# Patient Record
Sex: Female | Born: 1965 | Race: White | Hispanic: No
Health system: Southern US, Academic
[De-identification: ages and names within clinical notes are randomized; demographics above are authoritative.]

## PROBLEM LIST (undated history)

## (undated) ENCOUNTER — Encounter

## (undated) ENCOUNTER — Telehealth
Attending: Student in an Organized Health Care Education/Training Program | Primary: Student in an Organized Health Care Education/Training Program

## (undated) ENCOUNTER — Telehealth

## (undated) ENCOUNTER — Encounter
Attending: Student in an Organized Health Care Education/Training Program | Primary: Student in an Organized Health Care Education/Training Program

## (undated) ENCOUNTER — Encounter: Attending: Rheumatology | Primary: Rheumatology

## (undated) ENCOUNTER — Ambulatory Visit

## (undated) ENCOUNTER — Encounter: Payer: PRIVATE HEALTH INSURANCE | Attending: Rheumatology | Primary: Rheumatology

## (undated) ENCOUNTER — Encounter
Payer: PRIVATE HEALTH INSURANCE | Attending: Student in an Organized Health Care Education/Training Program | Primary: Student in an Organized Health Care Education/Training Program

## (undated) ENCOUNTER — Telehealth: Attending: Rheumatology | Primary: Rheumatology

## (undated) ENCOUNTER — Ambulatory Visit: Payer: PRIVATE HEALTH INSURANCE

## (undated) ENCOUNTER — Non-Acute Institutional Stay
Payer: PRIVATE HEALTH INSURANCE | Attending: Student in an Organized Health Care Education/Training Program | Primary: Student in an Organized Health Care Education/Training Program

## (undated) ENCOUNTER — Ambulatory Visit: Attending: Clinical | Primary: Clinical

## (undated) ENCOUNTER — Encounter: Attending: Internal Medicine | Primary: Internal Medicine

## (undated) ENCOUNTER — Ambulatory Visit
Payer: PRIVATE HEALTH INSURANCE | Attending: Student in an Organized Health Care Education/Training Program | Primary: Student in an Organized Health Care Education/Training Program

## (undated) ENCOUNTER — Ambulatory Visit
Payer: PRIVATE HEALTH INSURANCE | Attending: Physical Medicine & Rehabilitation | Primary: Physical Medicine & Rehabilitation

## (undated) ENCOUNTER — Telehealth: Attending: Internal Medicine | Primary: Internal Medicine

## (undated) DIAGNOSIS — I10 Essential (primary) hypertension: Secondary | ICD-10-CM

## (undated) DIAGNOSIS — J449 Chronic obstructive pulmonary disease, unspecified: Secondary | ICD-10-CM

## (undated) DIAGNOSIS — J45909 Unspecified asthma, uncomplicated: Secondary | ICD-10-CM

## (undated) DIAGNOSIS — G8929 Other chronic pain: Secondary | ICD-10-CM

## (undated) DIAGNOSIS — M549 Dorsalgia, unspecified: Secondary | ICD-10-CM

## (undated) HISTORY — PX: ABDOMINAL HYSTERECTOMY: SHX81

---

## 2006-06-29 ENCOUNTER — Emergency Department: Payer: Self-pay | Admitting: Emergency Medicine

## 2006-06-29 ENCOUNTER — Other Ambulatory Visit: Payer: Self-pay

## 2008-01-05 ENCOUNTER — Emergency Department: Payer: Self-pay | Admitting: Emergency Medicine

## 2009-04-07 ENCOUNTER — Emergency Department: Payer: Self-pay | Admitting: Emergency Medicine

## 2014-10-15 ENCOUNTER — Emergency Department: Payer: Self-pay | Admitting: Emergency Medicine

## 2015-01-08 ENCOUNTER — Emergency Department: Payer: Self-pay | Admitting: Emergency Medicine

## 2015-01-08 LAB — COMPREHENSIVE METABOLIC PANEL WITH GFR
Albumin: 3 g/dL — ABNORMAL LOW (ref 3.4–5.0)
Alkaline Phosphatase: 87 U/L (ref 46–116)
Anion Gap: 4 — ABNORMAL LOW (ref 7–16)
BUN: 12 mg/dL (ref 7–18)
Bilirubin,Total: 0.2 mg/dL (ref 0.2–1.0)
Calcium, Total: 8 mg/dL — ABNORMAL LOW (ref 8.5–10.1)
Chloride: 110 mmol/L — ABNORMAL HIGH (ref 98–107)
Co2: 28 mmol/L (ref 21–32)
Creatinine: 0.76 mg/dL (ref 0.60–1.30)
EGFR (African American): >60
EGFR (Non-African Amer.): >60
Glucose: 89 mg/dL (ref 65–99)
Osmolality: 282 (ref 275–301)
Potassium: 3.6 mmol/L (ref 3.5–5.1)
SGOT(AST): 18 U/L (ref 15–37)
SGPT (ALT): 14 U/L (ref 14–63)
Sodium: 142 mmol/L (ref 136–145)
Total Protein: 5.8 g/dL — ABNORMAL LOW (ref 6.4–8.2)

## 2015-01-08 LAB — CBC
HCT: 41.1 % (ref 35.0–47.0)
HGB: 13.5 g/dL (ref 12.0–16.0)
MCH: 28.4 pg (ref 26.0–34.0)
MCHC: 32.9 g/dL (ref 32.0–36.0)
MCV: 86 fL (ref 80–100)
PLATELETS: 211 10*3/uL (ref 150–440)
RBC: 4.76 10*6/uL (ref 3.80–5.20)
RDW: 14.8 % — AB (ref 11.5–14.5)
WBC: 5 10*3/uL (ref 3.6–11.0)

## 2015-08-13 ENCOUNTER — Emergency Department: Payer: Self-pay

## 2015-08-13 ENCOUNTER — Emergency Department
Admission: EM | Admit: 2015-08-13 | Discharge: 2015-08-14 | Disposition: A | Discharge status: Home / Self Care | Payer: Self-pay | Attending: Emergency Medicine | Admitting: Emergency Medicine

## 2015-08-13 ENCOUNTER — Encounter: Payer: Self-pay | Admitting: Emergency Medicine

## 2015-08-13 DIAGNOSIS — W07XXXA Fall from chair, initial encounter: Secondary | ICD-10-CM | POA: Insufficient documentation

## 2015-08-13 DIAGNOSIS — S199XXA Unspecified injury of neck, initial encounter: Secondary | ICD-10-CM | POA: Insufficient documentation

## 2015-08-13 DIAGNOSIS — Z720 Tobacco use: Secondary | ICD-10-CM | POA: Insufficient documentation

## 2015-08-13 DIAGNOSIS — T438X1A Poisoning by other psychotropic drugs, accidental (unintentional), initial encounter: Secondary | ICD-10-CM | POA: Insufficient documentation

## 2015-08-13 DIAGNOSIS — T50901A Poisoning by unspecified drugs, medicaments and biological substances, accidental (unintentional), initial encounter: Secondary | ICD-10-CM

## 2015-08-13 DIAGNOSIS — M542 Cervicalgia: Secondary | ICD-10-CM

## 2015-08-13 DIAGNOSIS — Z79899 Other long term (current) drug therapy: Secondary | ICD-10-CM | POA: Insufficient documentation

## 2015-08-13 DIAGNOSIS — Y9289 Other specified places as the place of occurrence of the external cause: Secondary | ICD-10-CM | POA: Insufficient documentation

## 2015-08-13 DIAGNOSIS — F1310 Sedative, hypnotic or anxiolytic abuse, uncomplicated: Secondary | ICD-10-CM | POA: Insufficient documentation

## 2015-08-13 DIAGNOSIS — T424X1A Poisoning by benzodiazepines, accidental (unintentional), initial encounter: Secondary | ICD-10-CM | POA: Insufficient documentation

## 2015-08-13 DIAGNOSIS — F1210 Cannabis abuse, uncomplicated: Secondary | ICD-10-CM | POA: Insufficient documentation

## 2015-08-13 DIAGNOSIS — S0990XA Unspecified injury of head, initial encounter: Secondary | ICD-10-CM | POA: Insufficient documentation

## 2015-08-13 DIAGNOSIS — S79912A Unspecified injury of left hip, initial encounter: Secondary | ICD-10-CM | POA: Insufficient documentation

## 2015-08-13 DIAGNOSIS — Y998 Other external cause status: Secondary | ICD-10-CM | POA: Insufficient documentation

## 2015-08-13 DIAGNOSIS — F1910 Other psychoactive substance abuse, uncomplicated: Secondary | ICD-10-CM

## 2015-08-13 DIAGNOSIS — Y9389 Activity, other specified: Secondary | ICD-10-CM | POA: Insufficient documentation

## 2015-08-13 DIAGNOSIS — S3992XA Unspecified injury of lower back, initial encounter: Secondary | ICD-10-CM | POA: Insufficient documentation

## 2015-08-13 DIAGNOSIS — F1410 Cocaine abuse, uncomplicated: Secondary | ICD-10-CM | POA: Insufficient documentation

## 2015-08-13 DIAGNOSIS — I10 Essential (primary) hypertension: Secondary | ICD-10-CM | POA: Insufficient documentation

## 2015-08-13 HISTORY — DX: Other chronic pain: G89.29

## 2015-08-13 HISTORY — DX: Dorsalgia, unspecified: M54.9

## 2015-08-13 HISTORY — DX: Chronic obstructive pulmonary disease, unspecified: J44.9

## 2015-08-13 HISTORY — DX: Essential (primary) hypertension: I10

## 2015-08-13 LAB — CBC WITH DIFFERENTIAL/PLATELET
BASOS PCT: 1 %
Basophils Absolute: 0 10*3/uL (ref 0–0.1)
Eosinophils Absolute: 0 10*3/uL (ref 0–0.7)
Eosinophils Relative: 0 %
HEMATOCRIT: 45.9 % (ref 35.0–47.0)
HEMOGLOBIN: 15 g/dL (ref 12.0–16.0)
LYMPHS ABS: 1.5 10*3/uL (ref 1.0–3.6)
LYMPHS PCT: 26 %
MCH: 28.5 pg (ref 26.0–34.0)
MCHC: 32.6 g/dL (ref 32.0–36.0)
MCV: 87.7 fL (ref 80.0–100.0)
MONOS PCT: 4 %
Monocytes Absolute: 0.2 10*3/uL (ref 0.2–0.9)
NEUTROS ABS: 4 10*3/uL (ref 1.4–6.5)
NEUTROS PCT: 69 %
Platelets: 209 10*3/uL (ref 150–440)
RBC: 5.24 MIL/uL — ABNORMAL HIGH (ref 3.80–5.20)
RDW: 15.4 % — ABNORMAL HIGH (ref 11.5–14.5)
WBC: 5.8 10*3/uL (ref 3.6–11.0)

## 2015-08-13 LAB — COMPREHENSIVE METABOLIC PANEL
ALBUMIN: 3.9 g/dL (ref 3.5–5.0)
ALK PHOS: 91 U/L (ref 38–126)
ALT: 11 U/L — ABNORMAL LOW (ref 14–54)
ANION GAP: 6 (ref 5–15)
AST: 14 U/L — ABNORMAL LOW (ref 15–41)
BILIRUBIN TOTAL: 0.2 mg/dL — AB (ref 0.3–1.2)
BUN: 8 mg/dL (ref 6–20)
CALCIUM: 8.7 mg/dL — AB (ref 8.9–10.3)
CO2: 28 mmol/L (ref 22–32)
Chloride: 105 mmol/L (ref 101–111)
Creatinine, Ser: 0.82 mg/dL (ref 0.44–1.00)
GFR calc Af Amer: >60 mL/min (ref >60)
GLUCOSE: 90 mg/dL (ref 65–99)
Potassium: 4.3 mmol/L (ref 3.5–5.1)
Sodium: 139 mmol/L (ref 135–145)
TOTAL PROTEIN: 6.4 g/dL — AB (ref 6.5–8.1)

## 2015-08-13 LAB — ETHANOL: Alcohol, Ethyl (B): <5 mg/dL (ref <5)

## 2015-08-13 NOTE — ED Notes (Signed)
Pt is very lethargic and speech is muffled. States she took 2 valium and gabapentin this afternoon

## 2015-08-13 NOTE — ED Provider Notes (Addendum)
[facility] Emergency Department Provider Note  ____________________________________________  Time seen: Approximately 8:59 PM  I have reviewed the triage vital signs and the nursing notes.   HISTORY  Chief Complaint Fall    HPI Kara Gill is a 49 y.o. female patient presents from fast track. She apparently had some Valium and gabapentin at home for her chronic pain. She then slipped and fell out of the chair and landed on her hip. Complains of low back pain and hip pain neck pain and headache. She is confused with slurred speech. X-rays of the C-spine and hip and chest were negative by CT her head that was negative as well. I'm uncertain as to whether patient's positive oxygen or not. I'm getting conflicting statements from the patient both in support of having oxygen and denying having oxygen or using oxygen at home. Patient is using all of her extremities equally and well. She is sitting up and laying back in bed spontaneously when she wants to although when I ask her to sit up complains of low back pain. It appears that the patient is intoxicated with her Valium.   Past Medical History  Diagnosis Date  . COPD (chronic obstructive pulmonary disease)   . Hypertension   . Chronic back pain     There are no active problems to display for this patient.   Past Surgical History  Procedure Laterality Date  . Abdominal hysterectomy      Current Outpatient Rx  Name  Route  Sig  Dispense  Refill  . FLUoxetine (PROZAC) 40 MG capsule   Oral   Take 40 mg by mouth daily.         Marland Kitchen gabapentin (NEURONTIN) 300 MG capsule   Oral   Take 300 mg by mouth 3 (three) times daily.           Allergies Codeine and Elavil  History reviewed. No pertinent family history.  Social History Social History  Substance Use Topics  . Smoking status: Current Every Day Smoker  . Smokeless tobacco: None  . Alcohol Use: No    Review of Systems Constitutional: No  fever/chills Eyes: No visual changes. ENT: No sore throat. Cardiovascular: Denies chest pain. Respiratory: Denies shortness of breath. Gastrointestinal: No abdominal pain.  No nausea, no vomiting.  No diarrhea.  No constipation. Genitourinary: Negative for dysuria. Musculoskeletal: See history of present illness Skin: Negative for rash. Neurological: Negative for headaches, focal weakness or numbness.  10-point ROS otherwise negative.  ____________________________________________   PHYSICAL EXAM:  VITAL SIGNS: ED Triage Vitals  Enc Vitals Group     BP 08/13/15 1822 131/83 mmHg     Pulse Rate 08/13/15 1822 89     Resp 08/13/15 1822 16     Temp 08/13/15 1822 99.9 F (37.7 C)     Temp Source 08/13/15 1822 Oral     SpO2 08/13/15 1822 100 %     Weight 08/13/15 1822 157 lb (71.215 kg)     Height 08/13/15 1822 5\' 3"  (1.6 m)     Head Cir --      Peak Flow --      Pain Score 08/13/15 1823 8     Pain Loc --      Pain Edu? --      Excl. in GC? --     Constitutional: Alert and oriented. Well appearing and in no acute distress. Eyes: Conjunctivae are normal. PERRL. EOMI. Head: Atraumatic. Nose: No congestion/rhinnorhea. Mouth/Throat: Mucous membranes are  moist.  Oropharynx non-erythematous. Neck: No stridor.   Cardiovascular: Normal rate, regular rhythm. Grossly normal heart sounds.  Good peripheral circulation. Respiratory: Normal respiratory effort.  No retractions. Lungs CTAB. Gastrointestinal: Soft and nontender. No distention. No abdominal bruits. No CVA tenderness. Musculoskeletal: No lower extremity tenderness nor edema.  No joint effusions. Low back is diffusely tender from about T11-S1. Neurologic:  Normal speech and language. No gross focal neurologic deficits are appreciated. No gait instability. Skin:  Skin is warm, dry and intact. No rash noted. Psychiatric: Mood and affect are normal. Speech and behavior are normal.  ____________________________________________    LABS (all labs ordered are listed, but only abnormal results are displayed)  Labs Reviewed  CBC WITH DIFFERENTIAL/PLATELET - Abnormal; Notable for the following:    RBC 5.24 (*)    RDW 15.4 (*)    All other components within normal limits  COMPREHENSIVE METABOLIC PANEL - Abnormal; Notable for the following:    Calcium 8.7 (*)    Total Protein 6.4 (*)    AST 14 (*)    ALT 11 (*)    Total Bilirubin 0.2 (*)    All other components within normal limits  ETHANOL   ____________________________________________  EKG  EKG read and interpreted by me shows normal sinus rhythm at a rate of 93 normal axis basically no acute changes ____________________________________________  RADIOLOGY  CT of the head is negative neck x-ray chest x-ray and pelvis are hip x-ray and low-dose L spine x-ray are all negative per the radiologist I also reviewed the films myself ____________________________________________   PROCEDURES    ____________________________________________   INITIAL IMPRESSION / ASSESSMENT AND PLAN / ED COURSE  Pertinent labs & imaging results that were available during my care of the patient were reviewed by me and considered in my medical decision making (see chart for details). Discussed with patient's mother I believe the patient has accidentally overdosed on her Valium and gabapentin I will keep her in the emergency room and observed for hopefully she will wake up in the morning to go home. At present she is on oxygen 2 L nasal cannula to maintain her O2 sats above 90. She will without the oxygen dropped down to 88 intermittently. _________________________________________   FINAL CLINICAL IMPRESSION(S) / ED DIAGNOSES  Final diagnoses:  Accidental overdose, initial encounter      Arnaldo Natal, MD 08/13/15 2305 Dr. Manson Passey will monitor the patient overnight hopefully we can discharge her in the morning she is still dependent on oxygen at this time  Arnaldo Natal,  MD 08/13/15 2306

## 2015-08-13 NOTE — ED Notes (Signed)
Lab called regarding urine sample.  Lab states they cannot find urine sample anywhere.  Attempted to get second sample using bed pan.  Pt unable to provide sample.  MD notified and states discontinue UDS.

## 2015-08-13 NOTE — ED Notes (Signed)
Pt placed on 2L O2 via [place] due to o2sat in 80s

## 2015-08-13 NOTE — ED Notes (Signed)
Per Dr. Darnelle Catalan, hold pt overnight and see if o2sat improves once pt is more awake.  Pt's mother leaving, contact info Theresia Majors [phone], daughter Gladis Riffle [phone]

## 2015-08-13 NOTE — ED Notes (Signed)
States she fell from chair  Landed on left hip The patient has a history of chronic back pain

## 2015-08-13 NOTE — ED Provider Notes (Signed)
[facility] Emergency Department Provider Note  ____________________________________________  Time seen: Approximately 6:23 PM  I have reviewed the triage vital signs and the nursing notes.   HISTORY  Chief Complaint Fall    HPI Kara Gill is a 49 y.o. female patient arrived via EMS secondary to a fall from a kitchen chair patient is complaining of left hip pain and neck pain. Patient denies any head injury.Patient is lethargic and 6 speech is muffled. Patient admits to taking 2 Valiums and Gabapentin this afternoon but she did not know the dosage. Patient is also complaining of neck pain. He was noticed that the patient had O2 sat on room air of 88-89%. Patient state that she is supposedly on home oxygen secondary to her COPD, but is not currently using it. Patient is rating her pain which is confined mostly to the hip as a 8/10.  Past Medical History  Diagnosis Date  . COPD (chronic obstructive pulmonary disease)   . Hypertension   . Chronic back pain     There are no active problems to display for this patient.   Past Surgical History  Procedure Laterality Date  . Abdominal hysterectomy      Current Outpatient Rx  Name  Route  Sig  Dispense  Refill  . FLUoxetine (PROZAC) 40 MG capsule   Oral   Take 40 mg by mouth daily.         Marland Kitchen gabapentin (NEURONTIN) 300 MG capsule   Oral   Take 300 mg by mouth 3 (three) times daily.           Allergies Codeine and Elavil  History reviewed. No pertinent family history.  Social History Social History  Substance Use Topics  . Smoking status: Current Every Day Smoker  . Smokeless tobacco: None  . Alcohol Use: No    Review of Systems Constitutional: No fever/chills. Patient's lethargic but responds to verbal stimuli. Eyes: No visual changes. ENT: No sore throat. Cardiovascular: Denies chest pain. Respiratory: Denies shortness of breath. Gastrointestinal: No abdominal pain.  No nausea, no  vomiting.  No diarrhea.  No constipation. Genitourinary: Negative for dysuria. Musculoskeletal: Complaining of neck, left hip and back pain Skin: Negative for rash. Neurological: Negative for headaches, focal weakness or numbness. Psychiatric:Depression Endocrine:Hypertension Hematological/Lymphatic: Allergic/Immunilogical: See medication list  10-point ROS otherwise negative.  ____________________________________________   PHYSICAL EXAM:  VITAL SIGNS: ED Triage Vitals  Enc Vitals Group     BP 08/13/15 1822 131/83 mmHg     Pulse Rate 08/13/15 1822 89     Resp 08/13/15 1822 16     Temp 08/13/15 1822 99.9 F (37.7 C)     Temp Source 08/13/15 1822 Oral     SpO2 08/13/15 1822 100 %     Weight 08/13/15 1822 157 lb (71.215 kg)     Height 08/13/15 1822  (1.6 m)     Head Cir --      Peak Flow --      Pain Score 08/13/15 1823 8     Pain Loc --      Pain Edu? --      Excl. in GC? --     Constitutional: Alert and oriented. Well appearing and in no acute distress. Patient's lethargic Eyes: Conjunctivae are normal. PERRL. EOMI. Head: Atraumatic. Nose: No congestion/rhinnorhea. Mouth/Throat: Mucous membranes are moist.  Oropharynx non-erythematous. Neck: No stridor.   cervical spine tenderness to palpation C4 and 5. Hematological/Lymphatic/Immunilogical: No cervical lymphadenopathy. Cardiovascular: Normal rate,  regular rhythm. Grossly normal heart sounds.  Good peripheral circulation. Respiratory: Normal respiratory effort.  No retractions. Lungs CTAB. Patient O2 sat is between 80 and 89% on room air. Patient placed on oxygen 4 L/M  and O2 sat increased to to 94%. Gastrointestinal: Soft and nontender. No distention. No abdominal bruits. No CVA tenderness. Musculoskeletal: Cervical guarding at C4-5. Guarding palpation the left lateral hip.  Neurologic:  Normal speech and language. No gross focal neurologic deficits are appreciated. No gait instability. Skin:  Skin is warm,  dry and intact. No rash noted. Psychiatric: Patient is referred lethargic ____________________________________________   LABS (all labs ordered are listed, but only abnormal results are displayed)  Labs Reviewed  CBC WITH DIFFERENTIAL/PLATELET - Abnormal; Notable for the following:    RBC 5.24 (*)    RDW 15.4 (*)    All other components within normal limits  COMPREHENSIVE METABOLIC PANEL - Abnormal; Notable for the following:    Calcium 8.7 (*)    Total Protein 6.4 (*)    AST 14 (*)    ALT 11 (*)    Total Bilirubin 0.2 (*)    All other components within normal limits  ETHANOL  URINE DRUG SCREEN, QUALITATIVE ([facility] ONLY)   ____________________________________________  EKG   ____________________________________________  RADIOLOGY   ____________________________________________   PROCEDURES  Procedure(s) performed: None  Critical Care performed: No  ____________________________________________   INITIAL IMPRESSION / ASSESSMENT AND PLAN / ED COURSE  Pertinent labs & imaging results that were available during my care of the patient were reviewed by me and considered in my medical decision making (see chart for details).  Patient continued to be lethargic and disorientated upon reexamination. Patient will be transferred over to the major area of the emergency room for definitive evaluation and treatment. ____________________________________________   FINAL CLINICAL IMPRESSION(S) / ED DIAGNOSES  Final diagnoses:  Neck pain      Joni Reining, PA-C 08/13/15 2055  Sharman Cheek, MD 08/13/15 [phone]

## 2015-08-13 NOTE — ED Notes (Addendum)
Pt reports falling after slipping on spilt tea.  Pt having difficulty keeping eyes open, mumbling speech.  Pt's mother reports pt was given valium last night at [facility] for back pain.  Pt c/o back pain.  Pt on 2L o2 currently, reports she is supposed to be on o2 at home but doesn't wear it.  Pt reports she does not have oxygen equipment at home.

## 2015-08-14 LAB — URINE DRUG SCREEN, QUALITATIVE (ARMC ONLY)
AMPHETAMINES, UR SCREEN: NOT DETECTED
Barbiturates, Ur Screen: NOT DETECTED
Benzodiazepine, Ur Scrn: POSITIVE — AB
CANNABINOID 50 NG, UR ~~LOC~~: POSITIVE — AB
COCAINE METABOLITE, UR ~~LOC~~: POSITIVE — AB
MDMA (ECSTASY) UR SCREEN: NOT DETECTED
Methadone Scn, Ur: POSITIVE — AB
Opiate, Ur Screen: NOT DETECTED
Phencyclidine (PCP) Ur S: NOT DETECTED
TRICYCLIC, UR SCREEN: POSITIVE — AB

## 2015-08-14 NOTE — ED Provider Notes (Signed)
I assumed care of the patient 11:00 PM from Dr. Juliette Alcide. Attempted to evaluate the patient however patient was very somnolent, appeared intoxicated. Patient's urine drug screen was ordered and revealed    Urine Drug Screen, Qualitative ([facility] only) (Final result)Abnormal Component (Lab Inquiry)    Collection Time Result Time Tricyclic, Ur Screen Amphetamines, Ur Screen MDMA (Ecstasy)Ur Screen Cocaine Metabolite,Ur [place] Opiate, Ur Screen   08/14/15 02:15:00 08/14/15 02:49:34 POSITIVE (A) NONE DETECTED NONE DETECTED POSITIVE (A) NONE DETECTED      Collection Time Result Time Phencyclidine (PCP) Ur S Cannabinoid 50 Ng, Ur [place] Barbiturates, Ur Screen Benzodiazepine, Ur Scrn Methadone Scn, Ur   08/14/15 02:15:00 08/14/15 02:49:34 NONE DETECTED POSITIVE (A) NONE DETECTED POSITIVE (A)  POSITIVE (A)       ----------------------------------------- 3:52 AM on 08/14/2015 -----------------------------------------  Patient more awake at this time. I asked the patient what medications or illegal drug use she had yesterday today and she stated "a few things". When asked what things she took she stated I don't know. Patient x-ray and CT reviewed revealing no gross abnormality   Imaging Results       DG Lumbar Spine Complete (Final result) Result time: 08/13/15 20:33:10   Final result by Rad Results In Interface (08/13/15 20:33:10)   Narrative:   CLINICAL DATA: Status post fall out of chair, with chronic lower back pain. Initial encounter.  EXAM: LUMBAR SPINE - COMPLETE 4+ VIEW  COMPARISON: None.  FINDINGS: There is no evidence of fracture or subluxation. The right-sided pars at the upper lumbar spine are not well assessed on oblique images. Vertebral bodies demonstrate normal height and alignment. Intervertebral disc spaces are preserved. The visualized neural foramina are grossly unremarkable in appearance.  The visualized bowel gas pattern is unremarkable in appearance; air and  stool are noted within the colon. The sacroiliac joints are within normal limits.  IMPRESSION: No evidence of fracture or subluxation along the lumbar spine.   Electronically Signed By: Roanna Raider M.D. On: 08/13/2015 20:33          CT Head Wo Contrast (Final result) Result time: 08/13/15 20:32:10   Final result by Rad Results In Interface (08/13/15 20:32:10)   Narrative:   CLINICAL DATA: Larey Seat from a chair landing on the left hip.  EXAM: CT HEAD WITHOUT CONTRAST  TECHNIQUE: Contiguous axial images were obtained from the base of the skull through the vertex without intravenous contrast.  COMPARISON: 01/08/2015  FINDINGS: Ventricles are normal in size and configuration. There are no parenchymal masses or mass effect. There is no evidence of an infarct. There are no extra-axial masses or abnormal fluid collections. There is no intracranial hemorrhage.  Visualized sinuses and mastoid air cells are clear. No skull lesion or fracture.  IMPRESSION: No intracranial abnormality.   Electronically Signed By: Amie Portland M.D. On: 08/13/2015 20:32          DG HIP UNILAT WITH PELVIS 1V LEFT (Final result) Result time: 08/13/15 19:11:18   Final result by Rad Results In Interface (08/13/15 19:11:18)   Narrative:   CLINICAL DATA: Status post fall from chair, landing on left hip. Left hip pain. Initial encounter.  EXAM: DG HIP (WITH OR WITHOUT PELVIS) 1V*L*  COMPARISON: None.  FINDINGS: There is no evidence of fracture or dislocation. Both femoral heads are seated normally within their respective acetabula. The proximal left femur appears intact. No significant degenerative change is appreciated. The sacroiliac joints are unremarkable in appearance.  The visualized bowel gas pattern is grossly unremarkable in  appearance. Scattered phleboliths are noted within the pelvis.  IMPRESSION: No evidence of fracture or  dislocation.   Electronically Signed By: Roanna Raider M.D. On: 08/13/2015 19:11          DG Chest 2 View (Final result) Result time: 08/13/15 19:10:04   Final result by Rad Results In Interface (08/13/15 19:10:04)   Narrative:   CLINICAL DATA: Cough and chest pain today. Status post fall today.  EXAM: CHEST 2 VIEW  COMPARISON: None.  FINDINGS: Heart size and mediastinal contours are within normal limits. Both lungs are clear. Visualized skeletal structures are unremarkable.  IMPRESSION: No acute disease.   Electronically Signed By: Drusilla Kanner M.D. On: 08/13/2015 19:10          DG Cervical Spine 2 or 3 views (Final result) Result time: 08/13/15 19:11:00   Procedure changed from [facility] Cervical Spine 2-3Vclearing      Final result by Rad Results In Interface (08/13/15 19:11:00)   Narrative:   CLINICAL DATA: Status post fall today. Neck pain. Initial encounter.  EXAM: CERVICAL SPINE - 2-3 VIEW  COMPARISON: None.  FINDINGS: Vertebral body height and alignment are normal. Intervertebral disc space height is normal. The facet joints are unremarkable. Prevertebral soft tissues appear normal. The lung apices are clear.  IMPRESSION: Negative exam.   Electronically Signed By: Drusilla Kanner M.D. On: 08/13/2015 19:11           Darci Current, MD 08/14/15 [phone]

## 2015-08-14 NOTE — ED Notes (Signed)
Oxygen removed by Dr. Manson Passey

## 2015-08-14 NOTE — ED Notes (Signed)
Called pt's mother and told her pt would be discharged soon.  Pt's mother states she will be at hospital in about 20-54min.

## 2015-08-14 NOTE — Discharge Instructions (Signed)
Due to patient's progressive lethargic and disorientation she is transferred over to the major area of the emergency room for definitive evaluation and treatment. Accidental Overdose A drug overdose occurs when a chemical substance (drug or medication) is used in amounts large enough to overcome a person. This may result in severe illness or death. This is a type of poisoning. Accidental overdoses of medications or other substances come from a variety of reasons. When this happens accidentally, it is often because the person taking the substance does not know enough about what they have taken. Drugs which commonly cause overdose deaths are alcohol, psychotropic medications (medications which affect the mind), pain medications, illegal drugs (street drugs) such as cocaine and heroin, and multiple drugs taken at the same time. It may result from careless behavior (such as over-indulging at a party). Other causes of overdose may include multiple drug use, a lapse in memory, or drug use after a period of no drug use.  Sometimes overdosing occurs because a person cannot remember if they have taken their medication.  A common unintentional overdose in young children involves multi-vitamins containing iron. Iron is a part of the hemoglobin molecule in blood. It is used to transport oxygen to living cells. When taken in small amounts, iron allows the body to restock hemoglobin. In large amounts, it causes problems in the body. If this overdose is not treated, it can lead to death. Never take medicines that show signs of tampering or do not seem quite right. Never take medicines in the dark or in poor lighting. Read the label and check each dose of medicine before you take it. When adults are poisoned, it happens most often through carelessness or lack of information. Taking medicines in the dark or taking medicine prescribed for someone else to treat the same type of problem is a dangerous practice. SYMPTOMS    Symptoms of overdose depend on the medication and amount taken. They can vary from over-activity with stimulant over-dosage, to sleepiness from depressants such as alcohol, narcotics and tranquilizers. Confusion, dizziness, nausea and vomiting may be present. If problems are severe enough coma and death may result. DIAGNOSIS  Diagnosis and management are generally straightforward if the drug is known. Otherwise it is more difficult. At times, certain symptoms and signs exhibited by the patient, or blood tests, can reveal the drug in question.  TREATMENT  In an emergency department, most patients can be treated with supportive measures. Antidotes may be available if there has been an overdose of opioids or benzodiazepines. A rapid improvement will often occur if this is the cause of overdose. At home or away from medical care:  There may be no immediate problems or warning signs in children.  Not everything works well in all cases of poisoning.  Take immediate action. Poisons may act quickly.  If you think someone has swallowed medicine or a household product, and the person is unconscious, having seizures (convulsions), or is not breathing, immediately call for an ambulance. IF a person is conscious and appears to be doing OK but has swallowed a poison:  Do not wait to see what effect the poison will have. Immediately call a poison control center (listed in the white pages of your telephone book under "Poison Control" or inside the front cover with other emergency numbers). Some poison control centers have TTY capability for the deaf. Check with your local center if you or someone in your family requires this service.  Keep the container so you can read  the label on the product for ingredients.  Describe what, when, and how much was taken and the age and condition of the person poisoned. Inform them if the person is vomiting, choking, drowsy, shows a change in color or temperature of skin, is  conscious or unconscious, or is convulsing.  Do not cause vomiting unless instructed by medical personnel. Do not induce vomiting or force liquids into a person who is convulsing, unconscious, or very drowsy. Stay calm and in control.   Activated charcoal also is sometimes used in certain types of poisoning and you may wish to add a supply to your emergency medicines. It is available without a prescription. Call a poison control center before using this medication. PREVENTION  Thousands of children die every year from unintentional poisoning. This may be from household chemicals, poisoning from carbon monoxide in a car, taking their parent's medications, or simply taking a few iron pills or vitamins with iron. Poisoning comes from unexpected sources.  Store medicines out of the sight and reach of children, preferably in a locked cabinet. Do not keep medications in a food cabinet. Always store your medicines in a secure place. Get rid of expired medications.  If you have children living with you or have them as occasional guests, you should have child-resistant caps on your medicine containers. Keep everything out of reach. Child proof your home.  If you are called to the telephone or to answer the door while you are taking a medicine, take the container with you or put the medicine out of the reach of small children.  Do not take your medication in front of children. Do not tell your child how good a medication is and how good it is for them. They may get the idea it is more of a treat.  If you are an adult and have accidentally taken an overdose, you need to consider how this happened and what can be done to prevent it from happening again. If this was from a street drug or alcohol, determine if there is a problem that needs addressing. If you are not sure a problems exists, it is easy to talk to a professional and ask them if they think you have a problem. It is better to handle this problem in  this way before it happens again and has a much worse consequence. Document Released: 02/11/2005 Document Revised: 02/20/2012 Document Reviewed: 07/20/2009 [facility] Patient Information 2015 [place], [place]. This information is not intended to replace advice given to you by your health care provider. Make sure you discuss any questions you have with your health care provider.  Polysubstance Abuse When people abuse more than one drug or type of drug it is called polysubstance or polydrug abuse. For example, many smokers also drink alcohol. This is one form of polydrug abuse. Polydrug abuse also refers to the use of a drug to counteract an unpleasant effect produced by another drug. It may also be used to help with withdrawal from another drug. People who take stimulants may become agitated. Sometimes this agitation is countered with a tranquilizer. This helps protect against the unpleasant side effects. Polydrug abuse also refers to the use of different drugs at the same time.  Anytime drug use is interfering with normal living activities, it has become abuse. This includes problems with family and friends. Psychological dependence has developed when your mind tells you that the drug is needed. This is usually followed by physical dependence which has developed when continuing increases of  drug are required to get the same feeling or "high". This is known as addiction or chemical dependency. A person's risk is much higher if there is a history of chemical dependency in the family. SIGNS OF CHEMICAL DEPENDENCY  You have been told by friends or family that drugs have become a problem.  You fight when using drugs.  You are having blackouts (not remembering what you do while using).  You feel sick from using drugs but continue using.  You lie about use or amounts of drugs (chemicals) used.  You need chemicals to get you going.  You are suffering in work performance or in school because of drug  use.  You get sick from use of drugs but continue to use anyway.  You need drugs to relate to people or feel comfortable in social situations.  You use drugs to forget problems. "Yes" answered to any of the above signs of chemical dependency indicates there are problems. The longer the use of drugs continues, the greater the problems will become. If there is a family history of drug or alcohol use, it is best not to experiment with these drugs. Continual use leads to tolerance. After tolerance develops more of the drug is needed to get the same feeling. This is followed by addiction. With addiction, drugs become the most important part of life. It becomes more important to take drugs than participate in the other usual activities of life. This includes relating to friends and family. Addiction is followed by dependency. Dependency is a condition where drugs are now needed not just to get high, but to feel normal. Addiction cannot be cured but it can be stopped. This often requires outside help and the care of professionals. Treatment centers are listed in the yellow pages under: Cocaine, Narcotics, and Alcoholics Anonymous. Most hospitals and clinics can refer you to a specialized care center. Talk to your caregiver if you need help. Document Released: 07/20/2005 Document Revised: 02/20/2012 Document Reviewed: 11/28/2005 [facility] Patient Information 2015 [place], [place]. This information is not intended to replace advice given to you by your health care provider. Make sure you discuss any questions you have with your health care provider.

## 2017-12-17 ENCOUNTER — Encounter: Payer: Self-pay | Admitting: Emergency Medicine

## 2017-12-17 ENCOUNTER — Emergency Department: Payer: No Typology Code available for payment source

## 2017-12-17 ENCOUNTER — Other Ambulatory Visit: Payer: Self-pay

## 2017-12-17 DIAGNOSIS — Z79899 Other long term (current) drug therapy: Secondary | ICD-10-CM | POA: Diagnosis not present

## 2017-12-17 DIAGNOSIS — M5442 Lumbago with sciatica, left side: Secondary | ICD-10-CM | POA: Diagnosis not present

## 2017-12-17 DIAGNOSIS — M5441 Lumbago with sciatica, right side: Secondary | ICD-10-CM | POA: Insufficient documentation

## 2017-12-17 DIAGNOSIS — G8929 Other chronic pain: Secondary | ICD-10-CM | POA: Diagnosis not present

## 2017-12-17 DIAGNOSIS — I10 Essential (primary) hypertension: Secondary | ICD-10-CM | POA: Insufficient documentation

## 2017-12-17 DIAGNOSIS — M545 Low back pain: Secondary | ICD-10-CM | POA: Diagnosis present

## 2017-12-17 DIAGNOSIS — M7918 Myalgia, other site: Secondary | ICD-10-CM | POA: Insufficient documentation
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
## 2017-12-18 ENCOUNTER — Emergency Department: Payer: No Typology Code available for payment source

## 2017-12-18 ENCOUNTER — Emergency Department
Admission: EM | Admit: 2017-12-18 | Discharge: 2017-12-18 | Disposition: A | Discharge status: Home / Self Care | Payer: No Typology Code available for payment source | Attending: Emergency Medicine | Admitting: Emergency Medicine

## 2017-12-18 ENCOUNTER — Encounter: Payer: Self-pay | Admitting: Radiology

## 2017-12-18 DIAGNOSIS — M5440 Lumbago with sciatica, unspecified side: Secondary | ICD-10-CM

## 2017-12-18 DIAGNOSIS — M7918 Myalgia, other site: Secondary | ICD-10-CM

## 2017-12-18 LAB — COMPREHENSIVE METABOLIC PANEL
ALBUMIN: 3.9 g/dL (ref 3.5–5.0)
ALK PHOS: 83 U/L (ref 38–126)
ALT: 13 U/L — ABNORMAL LOW (ref 14–54)
ANION GAP: 7 (ref 5–15)
AST: 22 U/L (ref 15–41)
BUN: 9 mg/dL (ref 6–20)
CALCIUM: 9 mg/dL (ref 8.9–10.3)
CO2: 26 mmol/L (ref 22–32)
Chloride: 105 mmol/L (ref 101–111)
Creatinine, Ser: 0.59 mg/dL (ref 0.44–1.00)
GFR calc Af Amer: >60 mL/min (ref >60)
GFR calc non Af Amer: >60 mL/min (ref >60)
GLUCOSE: 97 mg/dL (ref 65–99)
Potassium: 3.7 mmol/L (ref 3.5–5.1)
SODIUM: 138 mmol/L (ref 135–145)
Total Bilirubin: 0.6 mg/dL (ref 0.3–1.2)
Total Protein: 6.9 g/dL (ref 6.5–8.1)

## 2017-12-18 LAB — CBC
HCT: 42.1 % (ref 35.0–47.0)
HEMOGLOBIN: 14.4 g/dL (ref 12.0–16.0)
MCH: 29.6 pg (ref 26.0–34.0)
MCHC: 34.3 g/dL (ref 32.0–36.0)
MCV: 86.4 fL (ref 80.0–100.0)
Platelets: 190 10*3/uL (ref 150–440)
RBC: 4.87 MIL/uL (ref 3.80–5.20)
RDW: 13.7 % (ref 11.5–14.5)
WBC: 5.2 10*3/uL (ref 3.6–11.0)

## 2017-12-18 MED ORDER — IOPAMIDOL (ISOVUE-300) INJECTION 61%
100.0000 mL | Freq: Once | INTRAVENOUS | Status: AC | PRN
  Administered 2017-12-18: 100 mL via INTRAVENOUS

## 2017-12-18 MED ORDER — MORPHINE SULFATE (PF) 4 MG/ML IV SOLN
4.0000 mg | Freq: Once | INTRAVENOUS | Status: AC
  Administered 2017-12-18: 4 mg via INTRAVENOUS

## 2017-12-18 MED ORDER — ONDANSETRON HCL 4 MG/2ML IJ SOLN
INTRAMUSCULAR | Status: AC
  Administered 2017-12-18: 4 mg via INTRAVENOUS
  Filled 2017-12-18: qty 2

## 2017-12-18 MED ORDER — LIDOCAINE 5 % EX PTCH: 1.0000 | MEDICATED_PATCH | Freq: Two times a day (BID) | CUTANEOUS | 0 refills | Status: AC

## 2017-12-18 MED ORDER — TRAMADOL HCL 50 MG PO TABS: 50.0000 mg | ORAL_TABLET | Freq: Four times a day (QID) | ORAL | 0 refills | Status: AC | PRN

## 2017-12-18 MED ORDER — METHYLPREDNISOLONE SODIUM SUCC 125 MG IJ SOLR
125.0000 mg | Freq: Once | INTRAMUSCULAR | Status: AC
  Administered 2017-12-18: 125 mg via INTRAVENOUS
  Filled 2017-12-18: qty 2

## 2017-12-18 MED ORDER — ONDANSETRON HCL 4 MG/2ML IJ SOLN
4.0000 mg | Freq: Once | INTRAMUSCULAR | Status: AC
  Administered 2017-12-18: 4 mg via INTRAVENOUS

## 2017-12-18 MED ORDER — PREDNISONE 10 MG (21) PO TBPK: ORAL_TABLET | ORAL | 0 refills | Status: DC

## 2017-12-18 MED ORDER — MORPHINE SULFATE (PF) 4 MG/ML IV SOLN
INTRAVENOUS | Status: AC
  Administered 2017-12-18: 4 mg via INTRAVENOUS
  Filled 2017-12-18: qty 1

## 2017-12-18 MED ORDER — LIDOCAINE 5 % EX PTCH
1.0000 | MEDICATED_PATCH | CUTANEOUS | Status: DC
  Administered 2017-12-18: 1 via TRANSDERMAL
  Filled 2017-12-18: qty 1

## 2017-12-18 NOTE — ED Notes (Signed)
AAOx3.  Skin warm and dry.  NAD 
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
## 2017-12-18 NOTE — Discharge Instructions (Signed)
Please follow up with your primary care physician for further evaluation °

## 2017-12-18 NOTE — ED Notes (Addendum)
Unsuccessful IV attempt x3, (LAC, LW)

## 2017-12-18 NOTE — ED Notes (Signed)
MRI called stating ready for patient, explained that ED did not have staff to accompany patient and we were instructed that they should call radiology for staff to stay with them and patient.

## 2017-12-18 NOTE — ED Notes (Signed)
MRI returned call and stated radiology did not have staff either.  Between MRI and ED staff will wait until more staff available for patient safety.

## 2017-12-18 NOTE — ED Notes (Signed)
Patient resting quietly at this time, awaiting MRI.

## 2017-12-18 NOTE — ED Notes (Signed)
Patient sitting in wheelchair in no distress at this time.  

## 2018-01-15 ENCOUNTER — Encounter: Payer: Self-pay | Admitting: Urology

## 2018-01-15 ENCOUNTER — Encounter: Payer: Self-pay | Admitting: General Surgery

## 2018-02-08 ENCOUNTER — Emergency Department
Admission: EM | Admit: 2018-02-08 | Discharge: 2018-02-08 | Disposition: A | Discharge status: Home / Self Care | Payer: Self-pay | Attending: Student in an Organized Health Care Education/Training Program | Admitting: Student in an Organized Health Care Education/Training Program

## 2018-02-08 ENCOUNTER — Encounter: Payer: Self-pay | Admitting: Emergency Medicine

## 2018-02-08 DIAGNOSIS — M545 Low back pain, unspecified: Secondary | ICD-10-CM

## 2018-02-08 DIAGNOSIS — Z202 Contact with and (suspected) exposure to infections with a predominantly sexual mode of transmission: Secondary | ICD-10-CM | POA: Insufficient documentation

## 2018-02-08 DIAGNOSIS — F17210 Nicotine dependence, cigarettes, uncomplicated: Secondary | ICD-10-CM | POA: Insufficient documentation

## 2018-02-08 DIAGNOSIS — I10 Essential (primary) hypertension: Secondary | ICD-10-CM | POA: Insufficient documentation

## 2018-02-08 DIAGNOSIS — Z79899 Other long term (current) drug therapy: Secondary | ICD-10-CM | POA: Insufficient documentation

## 2018-02-08 DIAGNOSIS — J449 Chronic obstructive pulmonary disease, unspecified: Secondary | ICD-10-CM | POA: Insufficient documentation

## 2018-02-08 LAB — URINALYSIS, COMPLETE (UACMP) WITH MICROSCOPIC
Bilirubin Urine: NEGATIVE
Glucose, UA: NEGATIVE mg/dL
Ketones, ur: NEGATIVE mg/dL
Nitrite: NEGATIVE
Protein, ur: NEGATIVE mg/dL
Specific Gravity, Urine: 1.004 — ABNORMAL LOW (ref 1.005–1.030)
Squamous Epithelial / HPF: NONE SEEN
pH: 6 (ref 5.0–8.0)

## 2018-02-08 LAB — WET PREP, GENITAL
Sperm: NONE SEEN
Trich, Wet Prep: NONE SEEN
Yeast Wet Prep HPF POC: NONE SEEN

## 2018-02-08 LAB — CHLAMYDIA/NGC RT PCR (ARMC ONLY)
Chlamydia Tr: NOT DETECTED
N gonorrhoeae: NOT DETECTED

## 2018-02-08 MED ORDER — HYDROCODONE-ACETAMINOPHEN 10-325 MG PO TABS: 1.0000 | ORAL_TABLET | Freq: Four times a day (QID) | ORAL | 0 refills | Status: AC | PRN

## 2018-02-08 MED ORDER — CEFTRIAXONE SODIUM 250 MG IJ SOLR
250.0000 mg | Freq: Once | INTRAMUSCULAR | Status: AC
  Administered 2018-02-08: 250 mg via INTRAMUSCULAR
  Filled 2018-02-08: qty 250

## 2018-02-08 MED ORDER — METRONIDAZOLE 500 MG PO TABS
2000.0000 mg | ORAL_TABLET | Freq: Once | ORAL | Status: AC
  Administered 2018-02-08: 2000 mg via ORAL
  Filled 2018-02-08: qty 4

## 2018-02-08 MED ORDER — AZITHROMYCIN 500 MG PO TABS
1000.0000 mg | ORAL_TABLET | Freq: Once | ORAL | Status: AC
  Administered 2018-02-08: 1000 mg via ORAL
  Filled 2018-02-08: qty 2

## 2018-02-08 NOTE — ED Notes (Signed)
Pt went back out to lobby
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
## 2018-02-08 NOTE — ED Notes (Signed)
Pt. Verbalizes understanding of d/c instructions, medications, and follow-up. VS stable.  Pt. In NAD at time of d/c and denies further concerns regarding this visit. Pt. Stable at the time of departure from the unit, departing unit by the safest and most appropriate manner per that pt condition and limitations with all belongings accounted for. Pt advised to return to the ED at any time for emergent concerns, or for new/worsening symptoms.   

## 2018-02-08 NOTE — ED Provider Notes (Signed)
[facility] Emergency Department Provider Note  ____________________________________________  Time seen: Approximately 9:37 PM  I have reviewed the triage vital signs and the nursing notes.   HISTORY  Chief Complaint Back Pain    HPI Kara Gill is a 52 y.o. female presents to the emergency department with chronic low back pain and exposure to chlamydia.  Patient reports that she was previously under the care of pain management and has recently become gainfully employed with health insurance benefits.  Patient reports that she becomes eligible for her health insurance benefits on April 01, 2018.  Patient reports that she is seeking Norco 10 for her chronic low back pain and that she has not had a prescription for narcotics within the last 2 years.  Patient is requesting a 3-day supply for breakthrough pain.  She denies falls or mechanisms of trauma as well as bowel or bladder incontinence and saddle anesthesia.  Patient secondarily reports an episode of unprotected sex and is concerned about possible chlamydia infection.  She denies changes in vaginal discharge, dyspareunia, dysuria, hematuria and increased urine.  No flank pain.   Past Medical History:  Diagnosis Date  . Chronic back pain   . COPD (chronic obstructive pulmonary disease) (HCC)   . Hypertension     There are no active problems to display for this patient.   Past Surgical History:  Procedure Laterality Date  . ABDOMINAL HYSTERECTOMY    . CESAREAN SECTION      Prior to Admission medications   Medication Sig Start Date End Date Taking? Authorizing Provider  Albuterol Sulfate 108 (90 Base) MCG/ACT AEPB Inhale 2 puffs into the lungs every 4 (four) hours as needed.    [provider]  clonazePAM (KLONOPIN) 2 MG tablet Take 2 mg by mouth 2 (two) times daily.    [provider]  gabapentin (NEURONTIN) 300 MG capsule Take 300 mg by mouth 3 (three) times daily.    [provider]  HYDROcodone-acetaminophen (NORCO) 10-325 MG tablet Take 1 tablet by mouth every 6 (six) hours as needed for up to 3 days. 02/08/18 02/11/18  Orvil Feil, PA-C  lidocaine (LIDODERM) 5 % Place 1 patch onto the skin every 12 (twelve) hours. Remove & Discard patch within 12 hours or as directed by MD 12/18/17 12/18/18  Rebecka Apley, MD  predniSONE (STERAPRED UNI-PAK 21 TAB) 10 MG (21) TBPK tablet Take 6 tabs on day 1 Take 5 tabs on day 2 Take 4 tabs on day 3 Take 3 tabs on day 4 Take 2 tabs on day 5 Take 1 tab on day 6 12/18/17   Rebecka Apley, MD  pregabalin (LYRICA) 100 MG capsule Take 100 mg by mouth 2 (two) times daily.    [provider]  traMADol (ULTRAM) 50 MG tablet Take 1 tablet (50 mg total) by mouth every 6 (six) hours as needed. 12/18/17   Rebecka Apley, MD    Allergies Codeine and Elavil [amitriptyline hcl]  No family history on file.  Social History Social History   Tobacco Use  . Smoking status: Current Every Day Smoker  . Smokeless tobacco: Never Used  Substance Use Topics  . Alcohol use: No  . Drug use: Not on file     Review of Systems  Constitutional: No fever/chills Eyes: No visual changes. No discharge ENT: No upper respiratory complaints. Cardiovascular: no chest pain. Respiratory: no cough. No SOB. Gastrointestinal: No abdominal pain.  No nausea, no vomiting.  No diarrhea.  No constipation. Genitourinary: Patient has exposure to STD.  Musculoskeletal: Patient has low back pain.  Skin: Negative for rash, abrasions, lacerations, ecchymosis. Neurological: Negative for headaches, focal weakness or numbness.   ____________________________________________   PHYSICAL EXAM:  VITAL SIGNS: ED Triage Vitals  Enc Vitals Group     BP 02/08/18 1707 (!) 157/104     Pulse Rate 02/08/18 1707 72     Resp 02/08/18 1707 16     Temp 02/08/18 1707 98.4 F (36.9 C)     Temp Source 02/08/18 1707 Oral     SpO2 02/08/18 1707 94 %      Weight --      Height --      Head Circumference --      Peak Flow --      Pain Score 02/08/18 1705 9     Pain Loc --      Pain Edu? --      Excl. in GC? --      Constitutional: Alert and oriented. Well appearing and in no acute distress. Eyes: Conjunctivae are normal. PERRL. EOMI. Head: Atraumatic. ENT: Neck: No stridor. No cervical spine tenderness to palpation. Cardiovascular: Normal rate, regular rhythm. Normal S1 and S2.  Good peripheral circulation. Respiratory: Normal respiratory effort without tachypnea or retractions. Lungs CTAB. Good air entry to the bases with no decreased or absent breath sounds. Gastrointestinal: Bowel sounds 4 quadrants. Soft and nontender to palpation. No guarding or rigidity. No palpable masses. No distention. No CVA tenderness. Genitourinary: No cervical motion tenderness.  No excessive discharge within the vaginal vault. Musculoskeletal: Full range of motion to all extremities. No gross deformities appreciated. Neurologic:  Normal speech and language. No gross focal neurologic deficits are appreciated.  Skin:  Skin is warm, dry and intact. No rash noted.  ____________________________________________   LABS (all labs ordered are listed, but only abnormal results are displayed)  Labs Reviewed  WET PREP, GENITAL - Abnormal; Notable for the following components:      Result Value   Clue Cells Wet Prep HPF POC PRESENT (*)    WBC, Wet Prep HPF POC MODERATE (*)    All other components within normal limits  URINALYSIS, COMPLETE (UACMP) WITH MICROSCOPIC - Abnormal; Notable for the following components:   Color, Urine STRAW (*)    APPearance CLEAR (*)    Specific Gravity, Urine 1.004 (*)    Hgb urine dipstick SMALL (*)    Leukocytes, UA SMALL (*)    Bacteria, UA RARE (*)    All other components within normal limits  CHLAMYDIA/NGC RT PCR ([facility] ONLY)    ____________________________________________  EKG   ____________________________________________  RADIOLOGY   No results found.  ____________________________________________    PROCEDURES  Procedure(s) performed:    Procedures    Medications  cefTRIAXone (ROCEPHIN) injection 250 mg (250 mg Intramuscular Given 02/08/18 2047)  azithromycin (ZITHROMAX) tablet 1,000 mg (1,000 mg Oral Given 02/08/18 2045)  metroNIDAZOLE (FLAGYL) tablet 2,000 mg (2,000 mg Oral Given 02/08/18 2045)     ____________________________________________   INITIAL IMPRESSION / ASSESSMENT AND PLAN / ED COURSE  Pertinent labs & imaging results that were available during my care of the patient were reviewed by me and considered in my medical decision making (see chart for details).  Review of the [place] CSRS was performed in accordance of the [facility] prior to dispensing any controlled drugs.     Assessment and plan STD exposure Chronic back pain Patient presents to the emergency department  with STD exposure.  Differential diagnosis included gonorrhea, chlamydia, trichomoniasis and yeast vaginosis.  Patient tested negative for gonorrhea and chlamydia in the emergency department.  However, patient opted to be treated empirically and received Rocephin, azithromycin and metronidazole in the emergency department.  I consulted the [facility] drug database and patient has not received recent narcotic prescriptions within the last 2 years.  Patient's request for Norco was granted with the understanding that she would not receive additional Norco at future ED visits.  Patient voiced understanding.  All patient questions were answered.    ____________________________________________  FINAL CLINICAL IMPRESSION(S) / ED DIAGNOSES  Final diagnoses:  Acute bilateral low back pain without sciatica  STD exposure      NEW MEDICATIONS STARTED DURING THIS VISIT:  ED Discharge Orders        Ordered     HYDROcodone-acetaminophen (NORCO) 10-325 MG tablet  Every 6 hours PRN     02/08/18 2039          This chart was dictated using voice recognition software/Dragon. Despite best efforts to proofread, errors can occur which can change the meaning. Any change was purely unintentional.    Gasper Lloyd 02/08/18 2158    Willy Eddy, MD 02/08/18 2242

## 2018-02-08 NOTE — ED Notes (Signed)
See triage note  Presents with pain to lower back    States hx of same d/t injury about 20 years ago  Denies any recent injury   States pain has gotten worse over the past couple of days

## 2018-04-24 ENCOUNTER — Encounter: Payer: Self-pay | Admitting: Emergency Medicine

## 2018-04-24 ENCOUNTER — Other Ambulatory Visit: Payer: Self-pay

## 2018-04-24 ENCOUNTER — Emergency Department
Admission: EM | Admit: 2018-04-24 | Discharge: 2018-04-24 | Disposition: A | Discharge status: Home / Self Care | Payer: Self-pay | Attending: Emergency Medicine | Admitting: Emergency Medicine

## 2018-04-24 ENCOUNTER — Emergency Department: Payer: Self-pay

## 2018-04-24 DIAGNOSIS — Y9301 Activity, walking, marching and hiking: Secondary | ICD-10-CM | POA: Insufficient documentation

## 2018-04-24 DIAGNOSIS — F17200 Nicotine dependence, unspecified, uncomplicated: Secondary | ICD-10-CM | POA: Insufficient documentation

## 2018-04-24 DIAGNOSIS — Y999 Unspecified external cause status: Secondary | ICD-10-CM | POA: Insufficient documentation

## 2018-04-24 DIAGNOSIS — W19XXXA Unspecified fall, initial encounter: Secondary | ICD-10-CM

## 2018-04-24 DIAGNOSIS — W0110XA Fall on same level from slipping, tripping and stumbling with subsequent striking against unspecified object, initial encounter: Secondary | ICD-10-CM | POA: Insufficient documentation

## 2018-04-24 DIAGNOSIS — I10 Essential (primary) hypertension: Secondary | ICD-10-CM | POA: Insufficient documentation

## 2018-04-24 DIAGNOSIS — Z79899 Other long term (current) drug therapy: Secondary | ICD-10-CM | POA: Insufficient documentation

## 2018-04-24 DIAGNOSIS — J449 Chronic obstructive pulmonary disease, unspecified: Secondary | ICD-10-CM | POA: Insufficient documentation

## 2018-04-24 DIAGNOSIS — M545 Low back pain: Secondary | ICD-10-CM | POA: Insufficient documentation

## 2018-04-24 DIAGNOSIS — Y929 Unspecified place or not applicable: Secondary | ICD-10-CM | POA: Insufficient documentation

## 2018-04-24 MED ORDER — PREDNISONE 50 MG PO TABS: ORAL_TABLET | ORAL | 0 refills | Status: DC

## 2018-04-24 MED ORDER — KETOROLAC TROMETHAMINE 30 MG/ML IJ SOLN
30.0000 mg | Freq: Once | INTRAMUSCULAR | Status: AC
  Administered 2018-04-24: 30 mg via INTRAMUSCULAR
  Filled 2018-04-24: qty 1

## 2018-04-24 MED ORDER — ORPHENADRINE CITRATE 30 MG/ML IJ SOLN
60.0000 mg | Freq: Two times a day (BID) | INTRAMUSCULAR | Status: DC
  Administered 2018-04-24: 60 mg via INTRAMUSCULAR
  Filled 2018-04-24: qty 2

## 2018-04-24 NOTE — ED Notes (Signed)
See triage note  States she took a fall  Having pain to left hip which is moving into leg  No shortening noted  Increased pain with movement

## 2018-04-24 NOTE — ED Provider Notes (Signed)
[facility] Emergency Department Provider Note  ____________________________________________  Time seen: Approximately 4:45 PM  I have reviewed the triage vital signs and the nursing notes.   HISTORY  Chief Complaint Back Pain and Fall    HPI Kara Gill is a 52 y.o. female with a history of chronic low back pain, presents to the emergency department after a fall that occurred one day ago.  Patient reports that she fell on her "butt" and felt a crunch.  Patient denies weakness or changes in sensation of the lower extremities.  She has chronic radiculopathy of the left lower extremity. After receiving x-rays today at [facility] ED, patient reports that she "fell again".  After fall in emergency department today, patient is reporting that she is having mild neck pain.  Patient did not lose consciousness.  She is denying new blurry vision, nausea or headache.  No alleviating measures have been attempted.   Past Medical History:  Diagnosis Date  . Chronic back pain   . COPD (chronic obstructive pulmonary disease) (HCC)   . Hypertension     There are no active problems to display for this patient.   Past Surgical History:  Procedure Laterality Date  . ABDOMINAL HYSTERECTOMY    . CESAREAN SECTION      Prior to Admission medications   Medication Sig Start Date End Date Taking? Authorizing Provider  Albuterol Sulfate 108 (90 Base) MCG/ACT AEPB Inhale 2 puffs into the lungs every 4 (four) hours as needed.    [provider]  clonazePAM (KLONOPIN) 2 MG tablet Take 2 mg by mouth 2 (two) times daily.    [provider]  gabapentin (NEURONTIN) 300 MG capsule Take 300 mg by mouth 3 (three) times daily.    [provider]  lidocaine (LIDODERM) 5 % Place 1 patch onto the skin every 12 (twelve) hours. Remove & Discard patch within 12 hours or as directed by MD 12/18/17 12/18/18  Rebecka Apley, MD  predniSONE (DELTASONE) 50 MG tablet Take one 50 mg  tablet once daily for the next 5 days. 04/24/18   Orvil Feil, PA-C  pregabalin (LYRICA) 100 MG capsule Take 100 mg by mouth 2 (two) times daily.    [provider]  traMADol (ULTRAM) 50 MG tablet Take 1 tablet (50 mg total) by mouth every 6 (six) hours as needed. 12/18/17   Rebecka Apley, MD    Allergies Codeine and Elavil [amitriptyline hcl]  No family history on file.  Social History Social History   Tobacco Use  . Smoking status: Current Every Day Smoker  . Smokeless tobacco: Never Used  Substance Use Topics  . Alcohol use: No  . Drug use: Not on file     Review of Systems  Constitutional: No fever/chills Eyes: No visual changes. No discharge ENT: No upper respiratory complaints. Cardiovascular: no chest pain. Respiratory: no cough. No SOB. Gastrointestinal: No abdominal pain.  No nausea, no vomiting.  No diarrhea.  No constipation. Musculoskeletal: Patient has chronic low back pain.  Skin: Negative for rash, abrasions, lacerations, ecchymosis. Neurological: Negative for headaches, focal weakness or numbness.   ____________________________________________   PHYSICAL EXAM:  VITAL SIGNS: ED Triage Vitals  Enc Vitals Group     BP 04/24/18 1400 109/74     Pulse Rate 04/24/18 1400 69     Resp 04/24/18 1400 16     Temp 04/24/18 1400 98.5 F (36.9 C)     Temp Source 04/24/18 1400 Oral  SpO2 04/24/18 1400 96 %     Weight 04/24/18 1402 160 lb (72.6 kg)     Height 04/24/18 1402  (1.626 m)     Head Circumference --      Peak Flow --      Pain Score 04/24/18 1402 7     Pain Loc --      Pain Edu? --      Excl. in GC? --      Constitutional: Alert and oriented. Well appearing and in no acute distress. Eyes: Conjunctivae are normal. PERRL. EOMI. Head: Atraumatic. ENT:      Ears: TMs are pearly bilaterally.      Nose: No congestion/rhinnorhea.      Mouth/Throat: Mucous membranes are moist.  Neck: No stridor.  Patient has cervical spine  tenderness to palpation. Cardiovascular: Normal rate, regular rhythm. Normal S1 and S2.  Good peripheral circulation. Respiratory: Normal respiratory effort without tachypnea or retractions. Lungs CTAB. Good air entry to the bases with no decreased or absent breath sounds. Musculoskeletal: Full range of motion to all extremities. No gross deformities appreciated.  Patient has tenderness to palpation along the paraspinal muscles of the lumbar spine.  Positive straight leg raise, left. Neurologic:  Normal speech and language. No gross focal neurologic deficits are appreciated.  Skin:  Skin is warm, dry and intact. No rash noted.   ____________________________________________   LABS (all labs ordered are listed, but only abnormal results are displayed)  Labs Reviewed - No data to display ____________________________________________  EKG   ____________________________________________  RADIOLOGY Geraldo Pitter, personally viewed and evaluated these images (plain radiographs) as part of my medical decision making, as well as reviewing the written report by the radiologist.  Dg Cervical Spine 2-3 Views  Result Date: 04/24/2018 CLINICAL DATA:  RIGHT-sided neck pain after fall today. EXAM: CERVICAL SPINE - 2-3 VIEW COMPARISON:  None. FINDINGS: There is no evidence of cervical spine fracture or prevertebral soft tissue swelling. Alignment is normal. No other significant bone abnormalities are identified. IMPRESSION: Negative cervical spine radiographs. Electronically Signed   By: Bary Richard M.D.   On: 04/24/2018 16:46   Dg Lumbar Spine 2-3 Views  Result Date: 04/24/2018 CLINICAL DATA:  Low back pain due to a fall today. Initial encounter. EXAM: LUMBAR SPINE - 2-3 VIEW COMPARISON:  Plain films lumbar spine 12/17/2017. MRI lumbar spine 12/18/2017. FINDINGS: Convex left scoliosis is again seen. No fracture or listhesis. Mild degenerative disease is stable in appearance. Large colonic stool  burden noted. IMPRESSION: No acute abnormality. Convex left scoliosis. Large stool burden throughout the colon. Electronically Signed   By: Drusilla Kanner M.D.   On: 04/24/2018 15:33   Dg Sacrum/coccyx  Result Date: 04/24/2018 CLINICAL DATA:  Low back pain after fall today. EXAM: SACRUM AND COCCYX - 2+ VIEW COMPARISON:  None. FINDINGS: There is no evidence of fracture or other focal bone lesions. IMPRESSION: Normal sacrum and coccyx. Electronically Signed   By: Lupita Raider, M.D.   On: 04/24/2018 15:33    ____________________________________________    PROCEDURES  Procedure(s) performed:    Procedures    Medications  orphenadrine (NORFLEX) injection 60 mg (60 mg Intramuscular Given 04/24/18 1654)  ketorolac (TORADOL) 30 MG/ML injection 30 mg (30 mg Intramuscular Given 04/24/18 1653)     ____________________________________________   INITIAL IMPRESSION / ASSESSMENT AND PLAN / ED COURSE  Pertinent labs & imaging results that were available during my care of the patient were reviewed by me and  considered in my medical decision making (see chart for details).  Review of the [place] CSRS was performed in accordance of the [facility] prior to dispensing any controlled drugs.     Assessment and Plan:  Fall Patient presents to the emergency department after a fall that occurred 1 day ago.  Patient also experienced a fall in the emergency department today.  Differential diagnosis included contusion versus fracture.  No acute fractures were identified on x-rays of the cervical spine, lumbar spine and sacrum.  Patient was given injections of Norflex and Toradol in the emergency department.  She was discharged with prednisone.  Patient requested narcotic pain medication at discharge and request was denied given recent prescription for Norco in the [facility] drug database.  Vital signs are reassuring prior to discharge.  All patient questions were  answered.   ____________________________________________  FINAL CLINICAL IMPRESSION(S) / ED DIAGNOSES  Final diagnoses:  Fall, initial encounter      NEW MEDICATIONS STARTED DURING THIS VISIT:  ED Discharge Orders        Ordered    predniSONE (DELTASONE) 50 MG tablet     04/24/18 1704          This chart was dictated using voice recognition software/Dragon. Despite best efforts to proofread, errors can occur which can change the meaning. Any change was purely unintentional.    Orvil Feil, PA-C 04/24/18 1745    Sharman Cheek, MD 04/25/18 [phone]

## 2018-04-24 NOTE — ED Notes (Signed)
Back from x-ray   Requesting to use the bathroom  Pt in w/c and taking to bathroom

## 2018-04-24 NOTE — ED Triage Notes (Signed)
Larey Seat today due to leg gives out due to sciatica.  Landed on bottom and felt something crunch.  Tried tylenol and advil without relief.

## 2018-05-04 NOTE — ED Notes (Signed)
Late entry  Found on floor by Pt Advocate  Per pt states she fell   Was able to get back up with assistance  Provider made aware

## 2018-08-17 ENCOUNTER — Emergency Department
Admission: EM | Admit: 2018-08-17 | Discharge: 2018-08-18 | Disposition: A | Discharge status: Home / Self Care | Payer: Self-pay | Attending: Emergency Medicine | Admitting: Emergency Medicine

## 2018-08-17 ENCOUNTER — Encounter: Payer: Self-pay | Admitting: Emergency Medicine

## 2018-08-17 DIAGNOSIS — R0789 Other chest pain: Secondary | ICD-10-CM | POA: Insufficient documentation

## 2018-08-17 DIAGNOSIS — F17210 Nicotine dependence, cigarettes, uncomplicated: Secondary | ICD-10-CM | POA: Insufficient documentation

## 2018-08-17 DIAGNOSIS — Y999 Unspecified external cause status: Secondary | ICD-10-CM | POA: Insufficient documentation

## 2018-08-17 DIAGNOSIS — H538 Other visual disturbances: Secondary | ICD-10-CM | POA: Insufficient documentation

## 2018-08-17 DIAGNOSIS — J449 Chronic obstructive pulmonary disease, unspecified: Secondary | ICD-10-CM | POA: Insufficient documentation

## 2018-08-17 DIAGNOSIS — Z79899 Other long term (current) drug therapy: Secondary | ICD-10-CM | POA: Insufficient documentation

## 2018-08-17 DIAGNOSIS — Y939 Activity, unspecified: Secondary | ICD-10-CM | POA: Insufficient documentation

## 2018-08-17 DIAGNOSIS — Y92009 Unspecified place in unspecified non-institutional (private) residence as the place of occurrence of the external cause: Secondary | ICD-10-CM | POA: Insufficient documentation

## 2018-08-17 DIAGNOSIS — S161XXA Strain of muscle, fascia and tendon at neck level, initial encounter: Secondary | ICD-10-CM | POA: Insufficient documentation

## 2018-08-17 DIAGNOSIS — X500XXA Overexertion from strenuous movement or load, initial encounter: Secondary | ICD-10-CM | POA: Insufficient documentation

## 2018-08-17 DIAGNOSIS — R0602 Shortness of breath: Secondary | ICD-10-CM | POA: Insufficient documentation

## 2018-08-17 DIAGNOSIS — I10 Essential (primary) hypertension: Secondary | ICD-10-CM | POA: Insufficient documentation

## 2018-08-17 DIAGNOSIS — R51 Headache: Secondary | ICD-10-CM | POA: Insufficient documentation

## 2018-08-17 DIAGNOSIS — R064 Hyperventilation: Secondary | ICD-10-CM | POA: Insufficient documentation

## 2018-08-17 HISTORY — DX: Unspecified asthma, uncomplicated: J45.909
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
## 2018-08-18 ENCOUNTER — Emergency Department: Payer: Self-pay

## 2018-08-18 LAB — COMPREHENSIVE METABOLIC PANEL
ALK PHOS: 87 U/L (ref 38–126)
ALT: 11 U/L (ref 0–44)
ANION GAP: 4 — AB (ref 5–15)
AST: 15 U/L (ref 15–41)
Albumin: 3.6 g/dL (ref 3.5–5.0)
BUN: 11 mg/dL (ref 6–20)
CALCIUM: 8 mg/dL — AB (ref 8.9–10.3)
CO2: 26 mmol/L (ref 22–32)
CREATININE: 0.65 mg/dL (ref 0.44–1.00)
Chloride: 109 mmol/L (ref 98–111)
GFR calc non Af Amer: >60 mL/min (ref >60)
Glucose, Bld: 115 mg/dL — ABNORMAL HIGH (ref 70–99)
Potassium: 3.3 mmol/L — ABNORMAL LOW (ref 3.5–5.1)
Sodium: 139 mmol/L (ref 135–145)
Total Bilirubin: 0.4 mg/dL (ref 0.3–1.2)
Total Protein: 6.2 g/dL — ABNORMAL LOW (ref 6.5–8.1)

## 2018-08-18 LAB — CBC WITH DIFFERENTIAL/PLATELET
BASOS ABS: 0.1 10*3/uL (ref 0–0.1)
BASOS PCT: 1 %
EOS ABS: 0 10*3/uL (ref 0–0.7)
Eosinophils Relative: 0 %
HEMATOCRIT: 44.4 % (ref 35.0–47.0)
HEMOGLOBIN: 15.2 g/dL (ref 12.0–16.0)
Lymphocytes Relative: 26 %
Lymphs Abs: 2.1 10*3/uL (ref 1.0–3.6)
MCH: 29.3 pg (ref 26.0–34.0)
MCHC: 34.3 g/dL (ref 32.0–36.0)
MCV: 85.4 fL (ref 80.0–100.0)
Monocytes Absolute: 0.4 10*3/uL (ref 0.2–0.9)
Monocytes Relative: 5 %
NEUTROS ABS: 5.5 10*3/uL (ref 1.4–6.5)
NEUTROS PCT: 68 %
Platelets: 261 10*3/uL (ref 150–440)
RBC: 5.2 MIL/uL (ref 3.80–5.20)
RDW: 14.6 % — AB (ref 11.5–14.5)
WBC: 8.1 10*3/uL (ref 3.6–11.0)

## 2018-08-18 MED ORDER — FENTANYL CITRATE (PF) 100 MCG/2ML IJ SOLN
100.0000 ug | Freq: Once | INTRAMUSCULAR | Status: AC
  Administered 2018-08-18: 100 ug via INTRAVENOUS
  Filled 2018-08-18: qty 2

## 2018-08-18 MED ORDER — DIAZEPAM 5 MG PO TABS: 5.0000 mg | ORAL_TABLET | Freq: Three times a day (TID) | ORAL | 0 refills | Status: AC | PRN

## 2018-08-18 MED ORDER — IOHEXOL 350 MG/ML SOLN
75.0000 mL | Freq: Once | INTRAVENOUS | Status: AC | PRN
  Administered 2018-08-18: 75 mL via INTRAVENOUS

## 2018-08-18 MED ORDER — DICLOFENAC SODIUM 1 % TD GEL: 4.0000 g | Freq: Four times a day (QID) | TRANSDERMAL | 0 refills | Status: AC | PRN

## 2018-08-18 MED ORDER — DIAZEPAM 5 MG PO TABS
5.0000 mg | ORAL_TABLET | Freq: Once | ORAL | Status: AC
  Administered 2018-08-18: 5 mg via ORAL
  Filled 2018-08-18: qty 1

## 2018-08-18 MED ORDER — HALOPERIDOL LACTATE 5 MG/ML IJ SOLN
5.0000 mg | Freq: Once | INTRAMUSCULAR | Status: AC
Start: 2018-08-18 — End: 2018-08-18
  Administered 2018-08-18: 5 mg via INTRAVENOUS
  Filled 2018-08-18: qty 1

## 2018-08-18 MED ORDER — LIDOCAINE HCL (PF) 1 % IJ SOLN
5.0000 mL | Freq: Once | INTRAMUSCULAR | Status: AC
  Administered 2018-08-18: 5 mL via INTRADERMAL
  Filled 2018-08-18: qty 5

## 2018-08-18 MED ORDER — KETOROLAC TROMETHAMINE 30 MG/ML IJ SOLN
15.0000 mg | Freq: Once | INTRAMUSCULAR | Status: AC
  Administered 2018-08-18: 15 mg via INTRAVENOUS
  Filled 2018-08-18: qty 1

## 2018-08-18 NOTE — Discharge Instructions (Signed)
Please use your muscle relaxants only as needed for severe symptoms.  Use your pain gel to help relieve the tension in your neck.  Please establish care with primary care for reevaluation and return to the emergency department for any concerns.  It was a pleasure to take care of you today, and thank you for coming to our emergency department.  If you have any questions or concerns before leaving please ask the nurse to grab me and I'm more than happy to go through your aftercare instructions again.  If you were prescribed any opioid pain medication today such as Norco, Vicodin, Percocet, morphine, hydrocodone, or oxycodone please make sure you do not drive when you are taking this medication as it can alter your ability to drive safely.  If you have any concerns once you are home that you are not improving or are in fact getting worse before you can make it to your follow-up appointment, please do not hesitate to call [phone] and come back for further evaluation.  Merrily Brittle, MD  Results for orders placed or performed during the hospital encounter of 08/17/18  Comprehensive metabolic panel  Result Value Ref Range   Sodium 139 135 - 145 mmol/L   Potassium 3.3 (L) 3.5 - 5.1 mmol/L   Chloride 109 98 - 111 mmol/L   CO2 26 22 - 32 mmol/L   Glucose, Bld 115 (H) 70 - 99 mg/dL   BUN 11 6 - 20 mg/dL   Creatinine, Ser 6.31 0.44 - 1.00 mg/dL   Calcium 8.0 (L) 8.9 - 10.3 mg/dL   Total Protein 6.2 (L) 6.5 - 8.1 g/dL   Albumin 3.6 3.5 - 5.0 g/dL   AST 15 15 - 41 U/L   ALT 11 0 - 44 U/L   Alkaline Phosphatase 87 38 - 126 U/L   Total Bilirubin 0.4 0.3 - 1.2 mg/dL   GFR calc non Af Amer >60 >60 mL/min   GFR calc Af Amer >60 >60 mL/min   Anion gap 4 (L) 5 - 15  CBC with Differential  Result Value Ref Range   WBC 8.1 3.6 - 11.0 K/uL   RBC 5.20 3.80 - 5.20 MIL/uL   Hemoglobin 15.2 12.0 - 16.0 g/dL   HCT 49.7 02.6 - 37.8 %   MCV 85.4 80.0 - 100.0 fL   MCH 29.3 26.0 - 34.0 pg   MCHC 34.3 32.0 - 36.0  g/dL   RDW 58.8 (H) 50.2 - 77.4 %   Platelets 261 150 - 440 K/uL   Neutrophils Relative % 68 %   Neutro Abs 5.5 1.4 - 6.5 K/uL   Lymphocytes Relative 26 %   Lymphs Abs 2.1 1.0 - 3.6 K/uL   Monocytes Relative 5 %   Monocytes Absolute 0.4 0.2 - 0.9 K/uL   Eosinophils Relative 0 %   Eosinophils Absolute 0.0 0 - 0.7 K/uL   Basophils Relative 1 %   Basophils Absolute 0.1 0 - 0.1 K/uL   Ct Angio Neck W And/or Wo Contrast  Result Date: 08/18/2018 CLINICAL DATA:  Neck pain with blurry vision. EXAM: CT ANGIOGRAPHY NECK TECHNIQUE: Multidetector CT imaging of the neck was performed using the standard protocol during bolus administration of intravenous contrast. Multiplanar CT image reconstructions and MIPs were obtained to evaluate the vascular anatomy. Carotid stenosis measurements (when applicable) are obtained utilizing NASCET criteria, using the distal internal carotid diameter as the denominator. CONTRAST:  48mL OMNIPAQUE IOHEXOL 350 MG/ML SOLN COMPARISON:  None. FINDINGS: Aortic arch: There  is no calcific atherosclerosis of the aortic arch. There is no aneurysm, dissection or hemodynamically significant stenosis of the visualized ascending aorta and aortic arch. Conventional 3 vessel aortic branching pattern. The visualized proximal subclavian arteries are widely patent. Right carotid system: --Common carotid artery: Widely patent origin without common carotid artery dissection or aneurysm. --Internal carotid artery: No dissection, occlusion or aneurysm. No hemodynamically significant stenosis. --External carotid artery: No acute abnormality. Left carotid system: --Common carotid artery: Widely patent origin without common carotid artery dissection or aneurysm. --Internal carotid artery:No dissection, occlusion or aneurysm. No hemodynamically significant stenosis. --External carotid artery: No acute abnormality. Vertebral arteries: Left dominant configuration. Both origins are normal. No dissection,  occlusion or flow-limiting stenosis to the vertebrobasilar confluence. Skeleton: There is no bony spinal canal stenosis. No lytic or blastic lesion. Other neck: Normal pharynx, larynx and major salivary glands. No cervical lymphadenopathy. Unremarkable thyroid gland. Upper chest: No pneumothorax or pleural effusion. No nodules or masses. Review of the MIP images confirms the above findings IMPRESSION: 1. No vertebral artery dissection or other acute vascular abnormality. 2. Normal alignment of the cervical spine. No sizable disc herniation. Electronically Signed   By: Deatra Robinson M.D.   On: 08/18/2018 02:09

## 2018-09-26 ENCOUNTER — Encounter
Admit: 2018-09-26 | Discharge: 2018-09-26 | Disposition: A | Discharge status: Home / Self Care | Payer: PRIVATE HEALTH INSURANCE | Attending: Emergency Medicine

## 2018-09-26 DIAGNOSIS — M542 Cervicalgia: Principal | ICD-10-CM

## 2018-09-26 MED ORDER — CEPHALEXIN 250 MG CAPSULE
ORAL_CAPSULE | Freq: Four times a day (QID) | ORAL | 0 refills | 0.00000 days | Status: CP
Start: 2018-09-26 — End: 2018-10-01

## 2018-10-02 ENCOUNTER — Encounter
Admit: 2018-10-02 | Discharge: 2018-10-03 | Disposition: A | Discharge status: Home / Self Care | Payer: PRIVATE HEALTH INSURANCE | Attending: Emergency Medicine

## 2018-10-02 DIAGNOSIS — R1013 Epigastric pain: Principal | ICD-10-CM

## 2018-10-02 MED ORDER — PROMETHAZINE 25 MG TABLET
ORAL_TABLET | Freq: Four times a day (QID) | ORAL | 0 refills | 0 days | Status: CP | PRN
Start: 2018-10-02 — End: 2018-10-06

## 2018-10-05 ENCOUNTER — Ambulatory Visit
Admission: RE | Admit: 2018-10-05 | Discharge: 2018-10-05 | Disposition: A | Discharge status: Home / Self Care | Payer: Disability Insurance | Source: Ambulatory Visit | Attending: Dentistry | Admitting: Dentistry

## 2018-10-05 ENCOUNTER — Other Ambulatory Visit: Payer: Self-pay | Admitting: Dentistry

## 2018-10-05 DIAGNOSIS — M4186 Other forms of scoliosis, lumbar region: Secondary | ICD-10-CM | POA: Insufficient documentation

## 2018-10-05 DIAGNOSIS — M5430 Sciatica, unspecified side: Secondary | ICD-10-CM

## 2018-10-05 DIAGNOSIS — M5440 Lumbago with sciatica, unspecified side: Secondary | ICD-10-CM | POA: Insufficient documentation

## 2019-01-29 ENCOUNTER — Emergency Department
Admit: 2019-01-29 | Discharge: 2019-01-29 | Disposition: A | Discharge status: Home / Self Care | Payer: PRIVATE HEALTH INSURANCE

## 2019-01-29 ENCOUNTER — Encounter
Admit: 2019-01-29 | Discharge: 2019-01-29 | Disposition: A | Discharge status: Home / Self Care | Payer: PRIVATE HEALTH INSURANCE

## 2019-01-29 DIAGNOSIS — R05 Cough: Principal | ICD-10-CM

## 2019-01-29 DIAGNOSIS — R509 Fever, unspecified: Secondary | ICD-10-CM

## 2019-01-29 DIAGNOSIS — R0982 Postnasal drip: Secondary | ICD-10-CM

## 2019-01-29 MED ORDER — ALBUTEROL SULFATE HFA 90 MCG/ACTUATION AEROSOL INHALER
RESPIRATORY_TRACT | 0 refills | 0 days | Status: CP | PRN
Start: 2019-01-29 — End: 2020-01-29

## 2019-01-29 MED ORDER — AZITHROMYCIN 250 MG TABLET
ORAL_TABLET | Freq: Every day | ORAL | 0 refills | 0 days | Status: CP
Start: 2019-01-29 — End: 2019-02-02

## 2019-01-29 MED ORDER — CODEINE 6.3 MG-GUAIFENESIN 100 MG/5 ML ORAL LIQUID
ORAL | 0 refills | 0 days | Status: CP
Start: 2019-01-29 — End: 2019-02-03

## 2019-01-29 MED ORDER — BENZONATATE 100 MG CAPSULE
ORAL_CAPSULE | Freq: Three times a day (TID) | ORAL | 0 refills | 0 days | Status: CP
Start: 2019-01-29 — End: 2019-02-05

## 2019-06-13 ENCOUNTER — Encounter: Payer: Self-pay | Admitting: Emergency Medicine

## 2019-06-13 ENCOUNTER — Emergency Department
Admission: EM | Admit: 2019-06-13 | Discharge: 2019-06-13 | Disposition: A | Discharge status: Home / Self Care | Payer: Medicaid Other | Attending: Emergency Medicine | Admitting: Emergency Medicine

## 2019-06-13 ENCOUNTER — Other Ambulatory Visit: Payer: Self-pay

## 2019-06-13 ENCOUNTER — Emergency Department: Payer: Medicaid Other

## 2019-06-13 DIAGNOSIS — R4781 Slurred speech: Secondary | ICD-10-CM | POA: Diagnosis not present

## 2019-06-13 DIAGNOSIS — X509XXA Other and unspecified overexertion or strenuous movements or postures, initial encounter: Secondary | ICD-10-CM | POA: Insufficient documentation

## 2019-06-13 DIAGNOSIS — Y998 Other external cause status: Secondary | ICD-10-CM | POA: Diagnosis not present

## 2019-06-13 DIAGNOSIS — J449 Chronic obstructive pulmonary disease, unspecified: Secondary | ICD-10-CM | POA: Diagnosis not present

## 2019-06-13 DIAGNOSIS — Y92012 Bathroom of single-family (private) house as the place of occurrence of the external cause: Secondary | ICD-10-CM | POA: Insufficient documentation

## 2019-06-13 DIAGNOSIS — F1990 Other psychoactive substance use, unspecified, uncomplicated: Secondary | ICD-10-CM

## 2019-06-13 DIAGNOSIS — S8991XA Unspecified injury of right lower leg, initial encounter: Secondary | ICD-10-CM

## 2019-06-13 DIAGNOSIS — Y93E1 Activity, personal bathing and showering: Secondary | ICD-10-CM | POA: Insufficient documentation

## 2019-06-13 DIAGNOSIS — F17200 Nicotine dependence, unspecified, uncomplicated: Secondary | ICD-10-CM | POA: Diagnosis not present

## 2019-06-13 DIAGNOSIS — I10 Essential (primary) hypertension: Secondary | ICD-10-CM | POA: Insufficient documentation

## 2019-06-13 LAB — CBC WITH DIFFERENTIAL/PLATELET
Abs Immature Granulocytes: 0.05 10*3/uL (ref 0.00–0.07)
Basophils Absolute: 0 10*3/uL (ref 0.0–0.1)
Basophils Relative: 0 %
Eosinophils Absolute: 0 10*3/uL (ref 0.0–0.5)
Eosinophils Relative: 0 %
HCT: 44.2 % (ref 36.0–46.0)
Hemoglobin: 14.8 g/dL (ref 12.0–15.0)
Immature Granulocytes: 1 %
Lymphocytes Relative: 15 %
Lymphs Abs: 1.6 10*3/uL (ref 0.7–4.0)
MCH: 28 pg (ref 26.0–34.0)
MCHC: 33.5 g/dL (ref 30.0–36.0)
MCV: 83.7 fL (ref 80.0–100.0)
Monocytes Absolute: 0.7 10*3/uL (ref 0.1–1.0)
Monocytes Relative: 7 %
Neutro Abs: 8.3 10*3/uL — ABNORMAL HIGH (ref 1.7–7.7)
Neutrophils Relative %: 77 %
Platelets: 275 10*3/uL (ref 150–400)
RBC: 5.28 MIL/uL — ABNORMAL HIGH (ref 3.87–5.11)
RDW: 13.7 % (ref 11.5–15.5)
WBC: 10.6 10*3/uL — ABNORMAL HIGH (ref 4.0–10.5)
nRBC: 0 % (ref 0.0–0.2)

## 2019-06-13 LAB — COMPREHENSIVE METABOLIC PANEL
ALT: 15 U/L (ref 0–44)
AST: 21 U/L (ref 15–41)
Albumin: 4.3 g/dL (ref 3.5–5.0)
Alkaline Phosphatase: 91 U/L (ref 38–126)
Anion gap: 16 — ABNORMAL HIGH (ref 5–15)
BUN: 20 mg/dL (ref 6–20)
CO2: 18 mmol/L — ABNORMAL LOW (ref 22–32)
Calcium: 8.7 mg/dL — ABNORMAL LOW (ref 8.9–10.3)
Chloride: 103 mmol/L (ref 98–111)
Creatinine, Ser: 0.91 mg/dL (ref 0.44–1.00)
GFR calc Af Amer: >60 mL/min (ref >60)
GFR calc non Af Amer: >60 mL/min (ref >60)
Glucose, Bld: 86 mg/dL (ref 70–99)
Potassium: 3.2 mmol/L — ABNORMAL LOW (ref 3.5–5.1)
Sodium: 137 mmol/L (ref 135–145)
Total Bilirubin: 1.3 mg/dL — ABNORMAL HIGH (ref 0.3–1.2)
Total Protein: 7.4 g/dL (ref 6.5–8.1)

## 2019-06-13 LAB — SALICYLATE LEVEL: Salicylate Lvl: 7.3 mg/dL (ref 2.8–30.0)

## 2019-06-13 LAB — ACETAMINOPHEN LEVEL: Acetaminophen (Tylenol), Serum: <10 ug/mL — ABNORMAL LOW (ref 10–30)

## 2019-06-13 LAB — AMMONIA: Ammonia: 12 umol/L (ref 9–35)

## 2019-06-13 MED ORDER — NALOXONE HCL 2 MG/2ML IJ SOSY
2.0000 mg | PREFILLED_SYRINGE | Freq: Once | INTRAMUSCULAR | Status: AC
  Administered 2019-06-13: 2 mg via INTRAVENOUS
  Filled 2019-06-13: qty 2

## 2019-06-13 NOTE — ED Notes (Addendum)
Slight redness to R knee; pulses equal bilat in ankles; no major swelling noted; pt can move knee to 90 degree angle but states cannot move past that; pt rolling all around in bed; continues to have pressured-like speech; pt talking about unrelated events; pt explains that she fell on her left side but states that she hit her right knee; pt unsure how she turned it when she fell; pt states the knee moved "out of place and back into place;" pt states she heard a pop noise; pt states she applied some type of ointment to knee post fall without relief; pt calms down immediately after this RN leaves the room.
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
## 2019-06-13 NOTE — ED Notes (Signed)
This RN placed 22g at R hand but pt kept moving arms around and IV lost placement. Grn/lav/red tubes sent to lab.

## 2019-06-13 NOTE — ED Notes (Signed)
Pt leaving for CT. EKG will be completed once pt back from CT.

## 2019-06-13 NOTE — ED Triage Notes (Signed)
Patient states she was getting out of the shower when she twisted wrong and her leg gave out. Reports she fell and landed on her right knee. Now complaining of pain to right knee.

## 2019-06-13 NOTE — ED Notes (Signed)
Pt rambles at times then becomes lethargic-like. Pt's speech has become somewhat slurred.

## 2019-06-13 NOTE — ED Notes (Signed)
EKG completed by this RN. Took long time to complete EKG as pt asked multiple times to stay still but pt continued to roll around in bed mumbling nonsense. Pt educated about importance of EKG. Was able to get legible EKG once pt agreed to stay still for about 30 seconds.

## 2019-06-13 NOTE — ED Notes (Signed)
Dark grn on ice to lab.

## 2019-06-13 NOTE — ED Notes (Signed)
EKG to [place].

## 2019-06-13 NOTE — ED Notes (Signed)
Pt's mother called wanting update on pt. Pt's personal cell phone at bedside and charged. Pt prompted to call and update mother as needed.

## 2019-06-13 NOTE — ED Notes (Signed)
Called Daughter and mother for transport home.

## 2019-06-13 NOTE — ED Notes (Signed)
IV taped and given extra attention d/t pt moving around at random in bed.

## 2019-06-13 NOTE — ED Provider Notes (Signed)
Patient with somewhat anxious behavior, she does admit to occasional drug abuse "ice ".  She does not appear to be a danger to herself and she does  have decisional capacity, she is very anxious to leave and we will discharge her   Lavonia Drafts, MD 06/13/19 [phone]

## 2019-06-13 NOTE — ED Notes (Addendum)
Imaging to bedside. Pt verbally c/o pain to knee; moaning; pt flailing arms around and hunching shoulders. Pt states "it hurts all the way up into my butt."

## 2019-06-13 NOTE — ED Notes (Signed)
Pt given food tray and drink. Pt not to leave room until mother arrives to be ride home.

## 2019-08-15 ENCOUNTER — Other Ambulatory Visit: Payer: Self-pay

## 2019-08-15 ENCOUNTER — Emergency Department: Payer: Medicaid Other

## 2019-08-15 ENCOUNTER — Emergency Department
Admission: EM | Admit: 2019-08-15 | Discharge: 2019-08-15 | Disposition: A | Discharge status: Home / Self Care | Payer: Medicaid Other | Attending: Emergency Medicine | Admitting: Emergency Medicine

## 2019-08-15 ENCOUNTER — Encounter: Payer: Self-pay | Admitting: Emergency Medicine

## 2019-08-15 DIAGNOSIS — R51 Headache: Secondary | ICD-10-CM | POA: Diagnosis not present

## 2019-08-15 DIAGNOSIS — F17210 Nicotine dependence, cigarettes, uncomplicated: Secondary | ICD-10-CM | POA: Diagnosis not present

## 2019-08-15 DIAGNOSIS — J449 Chronic obstructive pulmonary disease, unspecified: Secondary | ICD-10-CM | POA: Diagnosis not present

## 2019-08-15 DIAGNOSIS — I10 Essential (primary) hypertension: Secondary | ICD-10-CM | POA: Diagnosis not present

## 2019-08-15 DIAGNOSIS — S0990XA Unspecified injury of head, initial encounter: Secondary | ICD-10-CM

## 2019-08-15 MED ORDER — DIAZEPAM 5 MG PO TABS
10.0000 mg | ORAL_TABLET | Freq: Once | ORAL | Status: AC
  Administered 2019-08-15: 10 mg via ORAL
  Filled 2019-08-15: qty 2

## 2019-08-15 MED ORDER — IBUPROFEN 600 MG PO TABS: 600.0000 mg | ORAL_TABLET | Freq: Three times a day (TID) | ORAL | 0 refills | Status: AC | PRN

## 2019-08-15 MED ORDER — ACETAMINOPHEN 500 MG PO TABS
1000.0000 mg | ORAL_TABLET | Freq: Once | ORAL | Status: AC
  Administered 2019-08-15: 1000 mg via ORAL
  Filled 2019-08-15: qty 2
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
## 2019-09-18 ENCOUNTER — Encounter
Admit: 2019-09-18 | Discharge: 2019-09-19 | Disposition: A | Discharge status: Home / Self Care | Payer: PRIVATE HEALTH INSURANCE

## 2019-09-18 DIAGNOSIS — J45909 Unspecified asthma, uncomplicated: Secondary | ICD-10-CM

## 2019-09-18 DIAGNOSIS — Z79899 Other long term (current) drug therapy: Secondary | ICD-10-CM

## 2019-09-18 DIAGNOSIS — Z888 Allergy status to other drugs, medicaments and biological substances status: Secondary | ICD-10-CM

## 2019-09-18 DIAGNOSIS — G8929 Other chronic pain: Secondary | ICD-10-CM

## 2019-09-18 DIAGNOSIS — Z885 Allergy status to narcotic agent status: Secondary | ICD-10-CM

## 2019-09-18 DIAGNOSIS — R0781 Pleurodynia: Secondary | ICD-10-CM

## 2019-09-18 DIAGNOSIS — Z86711 Personal history of pulmonary embolism: Secondary | ICD-10-CM

## 2019-09-18 DIAGNOSIS — Z8249 Family history of ischemic heart disease and other diseases of the circulatory system: Secondary | ICD-10-CM

## 2019-09-18 DIAGNOSIS — F17210 Nicotine dependence, cigarettes, uncomplicated: Secondary | ICD-10-CM

## 2019-09-18 DIAGNOSIS — S8991XA Unspecified injury of right lower leg, initial encounter: Secondary | ICD-10-CM

## 2019-09-18 DIAGNOSIS — M545 Low back pain: Secondary | ICD-10-CM

## 2019-09-18 DIAGNOSIS — S299XXA Unspecified injury of thorax, initial encounter: Secondary | ICD-10-CM

## 2019-09-18 MED ORDER — IBUPROFEN 800 MG TABLET
ORAL_TABLET | Freq: Three times a day (TID) | ORAL | 0 refills | 10 days | Status: CP | PRN
Start: 2019-09-18 — End: ?

## 2019-09-18 MED ORDER — LIDOCAINE 5 % TOPICAL PATCH
MEDICATED_PATCH | TRANSDERMAL | 0 refills | 30.00000 days | Status: CP
Start: 2019-09-18 — End: 2019-10-18

## 2019-10-27 ENCOUNTER — Other Ambulatory Visit: Payer: Self-pay

## 2019-10-27 ENCOUNTER — Encounter: Payer: Self-pay | Admitting: Emergency Medicine

## 2019-10-27 ENCOUNTER — Ambulatory Visit
Admission: EM | Admit: 2019-10-27 | Discharge: 2019-10-27 | Disposition: A | Discharge status: Home / Self Care | Payer: Medicaid Other | Attending: Emergency Medicine | Admitting: Emergency Medicine

## 2019-10-27 DIAGNOSIS — H6501 Acute serous otitis media, right ear: Secondary | ICD-10-CM | POA: Diagnosis not present

## 2019-10-27 DIAGNOSIS — Z20822 Contact with and (suspected) exposure to covid-19: Secondary | ICD-10-CM

## 2019-10-27 DIAGNOSIS — J22 Unspecified acute lower respiratory infection: Secondary | ICD-10-CM | POA: Diagnosis present

## 2019-10-27 DIAGNOSIS — Z20828 Contact with and (suspected) exposure to other viral communicable diseases: Secondary | ICD-10-CM | POA: Diagnosis present

## 2019-10-27 LAB — RAPID INFLUENZA A&B ANTIGENS
Influenza A (ARMC): NEGATIVE
Influenza B (ARMC): NEGATIVE

## 2019-10-27 LAB — RAPID STREP SCREEN (MED CTR MEBANE ONLY): Streptococcus, Group A Screen (Direct): NEGATIVE

## 2019-10-27 MED ORDER — PREDNISONE 10 MG (21) PO TBPK: ORAL_TABLET | ORAL | 0 refills | Status: AC

## 2019-10-27 NOTE — Discharge Instructions (Signed)
You are being treated for an lower respiratory infection and fluid in your right ear. Take your medications as prescribed.      Person Under Monitoring Name: Kara Gill  Location: [place] [address]   Infection Prevention Recommendations for Individuals Confirmed to have, or Being Evaluated for, 2019 Novel Coronavirus (COVID-19) Infection Who Receive Care at Home  Individuals who are confirmed to have, or are being evaluated for, COVID-19 should follow the prevention steps below until a healthcare provider or local or state health department says they can return to normal activities.  Stay home except to get medical care You should restrict activities outside your home, except for getting medical care. Do not go to work, school, or public areas, and do not use public transportation or taxis.  Call ahead before visiting your doctor Before your medical appointment, call the healthcare provider and tell them that you have, or are being evaluated for, COVID-19 infection. This will help the healthcare providers office take steps to keep other people from getting infected. Ask your healthcare provider to call the local or state health department.  Monitor your symptoms Seek prompt medical attention if your illness is worsening (e.g., difficulty breathing). Before going to your medical appointment, call the healthcare provider and tell them that you have, or are being evaluated for, COVID-19 infection. Ask your healthcare provider to call the local or state health department.  Wear a facemask You should wear a facemask that covers your nose and mouth when you are in the same room with other people and when you visit a healthcare provider. People who live with or visit you should also wear a facemask while they are in the same room with you.  Separate yourself from other people in your home As much as possible, you should stay in a different room from other  people in your home. Also, you should use a separate bathroom, if available.  Avoid sharing household items You should not share dishes, drinking glasses, cups, eating utensils, towels, bedding, or other items with other people in your home. After using these items, you should wash them thoroughly with soap and water.  Cover your coughs and sneezes Cover your mouth and nose with a tissue when you cough or sneeze, or you can cough or sneeze into your sleeve. Throw used tissues in a lined trash can, and immediately wash your hands with soap and water for at least 20 seconds or use an alcohol-based hand rub.  Wash your [facility] your hands often and thoroughly with soap and water for at least 20 seconds. You can use an alcohol-based hand sanitizer if soap and water are not available and if your hands are not visibly dirty. Avoid touching your eyes, nose, and mouth with unwashed hands.   Prevention Steps for Caregivers and Household Members of Individuals Confirmed to have, or Being Evaluated for, COVID-19 Infection Being Cared for in the Home  If you live with, or provide care at home for, a person confirmed to have, or being evaluated for, COVID-19 infection please follow these guidelines to prevent infection:  Follow healthcare providers instructions Make sure that you understand and can help the patient follow any healthcare provider instructions for all care.  Provide for the patients basic needs You should help the patient with basic needs in the home and provide support for getting groceries, prescriptions, and other personal needs.  Monitor the patients symptoms If they are getting sicker, call his  or her medical provider and tell them that the patient has, or is being evaluated for, COVID-19 infection. This will help the healthcare providers office take steps to keep other people from getting infected. Ask the healthcare provider to call the local or state health  department.  Limit the number of people who have contact with the patient If possible, have only one caregiver for the patient. Other household members should stay in another home or place of residence. If this is not possible, they should stay in another room, or be separated from the patient as much as possible. Use a separate bathroom, if available. Restrict visitors who do not have an essential need to be in the home.  Keep older adults, very young children, and other sick people away from the patient Keep older adults, very young children, and those who have compromised immune systems or chronic health conditions away from the patient. This includes people with chronic heart, lung, or kidney conditions, diabetes, and cancer.  Ensure good ventilation Make sure that shared spaces in the home have good air flow, such as from an air conditioner or an opened window, weather permitting.  Wash your hands often Wash your hands often and thoroughly with soap and water for at least 20 seconds. You can use an alcohol based hand sanitizer if soap and water are not available and if your hands are not visibly dirty. Avoid touching your eyes, nose, and mouth with unwashed hands. Use disposable paper towels to dry your hands. If not available, use dedicated cloth towels and replace them when they become wet.  Wear a facemask and gloves Wear a disposable facemask at all times in the room and gloves when you touch or have contact with the patients blood, body fluids, and/or secretions or excretions, such as sweat, saliva, sputum, nasal mucus, vomit, urine, or feces.  Ensure the mask fits over your nose and mouth tightly, and do not touch it during use. Throw out disposable facemasks and gloves after using them. Do not reuse. Wash your hands immediately after removing your facemask and gloves. If your personal clothing becomes contaminated, carefully remove clothing and launder. Wash your hands after  handling contaminated clothing. Place all used disposable facemasks, gloves, and other waste in a lined container before disposing them with other household waste. Remove gloves and wash your hands immediately after handling these items.  Do not share dishes, glasses, or other household items with the patient Avoid sharing household items. You should not share dishes, drinking glasses, cups, eating utensils, towels, bedding, or other items with a patient who is confirmed to have, or being evaluated for, COVID-19 infection. After the person uses these items, you should wash them thoroughly with soap and water.  Wash laundry thoroughly Immediately remove and wash clothes or bedding that have blood, body fluids, and/or secretions or excretions, such as sweat, saliva, sputum, nasal mucus, vomit, urine, or feces, on them. Wear gloves when handling laundry from the patient. Read and follow directions on labels of laundry or clothing items and detergent. In general, wash and dry with the warmest temperatures recommended on the label.  Clean all areas the individual has used often Clean all touchable surfaces, such as counters, tabletops, doorknobs, bathroom fixtures, toilets, phones, keyboards, tablets, and bedside tables, every day. Also, clean any surfaces that may have blood, body fluids, and/or secretions or excretions on them. Wear gloves when cleaning surfaces the patient has come in contact with. Use a diluted bleach solution (e.g., dilute  bleach with 1 part bleach and 10 parts water) or a household disinfectant with a label that says EPA-registered for coronaviruses. To make a bleach solution at home, add 1 tablespoon of bleach to 1 quart (4 cups) of water. For a larger supply, add  cup of bleach to 1 gallon (16 cups) of water. Read labels of cleaning products and follow recommendations provided on product labels. Labels contain instructions for safe and effective use of the cleaning product  including precautions you should take when applying the product, such as wearing gloves or eye protection and making sure you have good ventilation during use of the product. Remove gloves and wash hands immediately after cleaning.  Monitor yourself for signs and symptoms of illness Caregivers and household members are considered close contacts, should monitor their health, and will be asked to limit movement outside of the home to the extent possible. Follow the monitoring steps for close contacts listed on the symptom monitoring form.   ? If you have additional questions, contact your local health department or call the epidemiologist on call at [phone] (available 24/7). ? This guidance is subject to change. For the most up-to-date guidance from [facility], please refer to their website: [email or web address]

## 2019-10-27 NOTE — ED Provider Notes (Signed)
[facility] [place] [facility] [place]   Name: Kara Gill DOB: 02-19-1966 MRN: [id number] CSN: [id number] PCP: System, Pcp Not In  Arrival date and time:  10/27/19 1423  Chief Complaint:  Sore Throat, Otalgia, and Cough   NOTE: Prior to seeing the patient today, I have reviewed the triage nursing documentation and vital signs. Clinical staff has updated patient's PMH/PSHx, current medication list, and drug allergies/intolerances to ensure comprehensive history available to assist in medical decision making.   History:   HPI: Kara Gill is a 53 y.o. female who presents today with complaints of bilateral ear pain, cough, headaches and body-aches for the past 3 days. Her mother has similar symptoms that started around the same time. She denies contact with anyone with known or possible COVID-19. She had tried multiple OTC medications with little to no relief. She does not work.    Past Medical History:  Diagnosis Date  . Asthma   . Chronic back pain   . COPD (chronic obstructive pulmonary disease) (HCC)   . Hypertension     Past Surgical History:  Procedure Laterality Date  . ABDOMINAL HYSTERECTOMY    . CESAREAN SECTION      Family History  Family history unknown: Yes    Social History   Tobacco Use  . Smoking status: Current Every Day Smoker    Types: Cigarettes  . Smokeless tobacco: Never Used  Substance Use Topics  . Alcohol use: No  . Drug use: Not on file    There are no active problems to display for this patient.   Home Medications:    No outpatient medications have been marked as taking for the 10/27/19 encounter [facility](Hospital Encounter).    Allergies:   Codeine and Elavil [amitriptyline hcl]  Review of Systems (ROS): Review of Systems  Constitutional: Positive for appetite change, chills and fatigue. Negative for activity change and fever.  HENT: Positive for congestion, ear pain and sore throat. Negative for ear discharge, postnasal drip, sinus  pain, sneezing and tinnitus.   Respiratory: Positive for cough. Negative for wheezing.   Gastrointestinal: Positive for nausea.  Musculoskeletal: Positive for myalgias.  Neurological: Positive for weakness. Negative for dizziness.     Vital Signs: Today's Vitals   10/27/19 1440 10/27/19 1444 10/27/19 1604  BP:  (!) 166/107   Pulse:  84   Resp:  16   Temp:  98.5 F (36.9 C)   TempSrc:  Oral   SpO2:  97%   Weight: 174 lb (78.9 kg)    Height: 5\' 4"  (1.626 m)    PainSc: 8   8     Physical Exam: Physical Exam Vitals signs and nursing note reviewed.  Constitutional:      Appearance: She is well-developed.  HENT:     Head: Normocephalic.     Right Ear: A middle ear effusion is present.     Left Ear: Tympanic membrane normal.     Nose: Congestion present.     Mouth/Throat:     Mouth: Mucous membranes are moist.     Pharynx: Posterior oropharyngeal erythema present.     Tonsils: No tonsillar exudate.  Cardiovascular:     Rate and Rhythm: Normal rate and regular rhythm.     Heart sounds: Normal heart sounds.  Pulmonary:     Effort: Pulmonary effort is normal.     Breath sounds: Normal breath sounds.  Skin:    General: Skin is warm and dry.  Neurological:  Mental Status: She is alert.  Psychiatric:        Mood and Affect: Mood normal.        Behavior: Behavior normal.      Urgent Care Treatments / Results:   LABS: PLEASE NOTE: all labs that were ordered this encounter are listed, however only abnormal results are displayed. Labs Reviewed  RAPID STREP SCREEN ([facility] [place] ONLY)  RAPID INFLUENZA A&B ANTIGENS ([facility] ONLY)  NOVEL CORONAVIRUS, NAA (HOSP ORDER, SEND-OUT TO REF LAB; TAT 18-24 HRS)  CULTURE, GROUP A STREP [facility])    EKG: -None  RADIOLOGY: No results found.  PROCEDURES: Procedures  MEDICATIONS RECEIVED THIS VISIT: Medications - No data to display  PERTINENT CLINICAL COURSE NOTES/UPDATES:   Initial Impression / Assessment and Plan / Urgent  Care Course:  Pertinent labs & imaging results that were available during my care of the patient were personally reviewed by me and considered in my medical decision making (see lab/imaging section of note for values and interpretations).  Kara Gill is a 53 y.o. female who presents to [facility] today with complaints of bilateral ear pain, cough and weakness, diagnosed with R acute serous otitis media and a lower respiratory tract inflammation, and treated as such with the medications below. NP and patient reviewed discharge instructions below during visit.   Patient is well appearing overall in clinic today. She does not appear to be in any acute distress. Presenting symptoms (see HPI) and exam as documented above.   I have reviewed the follow up and strict return precautions for any new or worsening symptoms. Patient is aware of symptoms that would be deemed urgent/emergent, and would thus require further evaluation either here or in the emergency department. At the time of discharge, she verbalized understanding and consent with the discharge plan as it was reviewed with her. All questions were fielded by provider and/or clinic staff prior to patient discharge.    Final Clinical Impressions / Urgent Care Diagnoses:   Final diagnoses:  Non-recurrent acute serous otitis media of right ear  Lower resp. tract infection  Encounter for laboratory testing for COVID-19 virus    New Prescriptions:  [place] Controlled Substance Registry consulted? Not Applicable  Meds ordered this encounter  Medications  . predniSONE (STERAPRED UNI-PAK 21 TAB) 10 MG (21) TBPK tablet    Sig: Take as instructed on package (60, 50, 40, 30, 20, 10)    Dispense:  1 each    Refill:  0      Discharge Instructions     You are being treated for an lower respiratory infection and fluid in your right ear. Take your medications as prescribed.      Person Under Monitoring Name: TEKELA GARGUILO  Location: [address] [place] [address]   Infection Prevention Recommendations for Individuals Confirmed to have, or Being Evaluated for, 2019 Novel Coronavirus (COVID-19) Infection Who Receive Care at Home  Individuals who are confirmed to have, or are being evaluated for, COVID-19 should follow the prevention steps below until a healthcare provider or local or state health department says they can return to normal activities.  Stay home except to get medical care You should restrict activities outside your home, except for getting medical care. Do not go to work, school, or public areas, and do not use public transportation or taxis.  Call ahead before visiting your doctor Before your medical appointment, call the healthcare provider and tell them that you have, or  are being evaluated for, COVID-19 infection. This will help the healthcare provider's office take steps to keep other people from getting infected. Ask your healthcare provider to call the local or state health department.  Monitor your symptoms Seek prompt medical attention if your illness is worsening (e.g., difficulty breathing). Before going to your medical appointment, call the healthcare provider and tell them that you have, or are being evaluated for, COVID-19 infection. Ask your healthcare provider to call the local or state health department.  Wear a facemask You should wear a facemask that covers your nose and mouth when you are in the same room with other people and when you visit a healthcare provider. People who live with or visit you should also wear a facemask while they are in the same room with you.  Separate yourself from other people in your home As much as possible, you should stay in a different room from other people in your home. Also, you should use a separate bathroom, if available.  Avoid sharing household items You should not share dishes, drinking glasses, cups, eating utensils, towels,  bedding, or other items with other people in your home. After using these items, you should wash them thoroughly with soap and water.  Cover your coughs and sneezes Cover your mouth and nose with a tissue when you cough or sneeze, or you can cough or sneeze into your sleeve. Throw used tissues in a lined trash can, and immediately wash your hands with soap and water for at least 20 seconds or use an alcohol-based hand rub.  Wash your [facility] your hands often and thoroughly with soap and water for at least 20 seconds. You can use an alcohol-based hand sanitizer if soap and water are not available and if your hands are not visibly dirty. Avoid touching your eyes, nose, and mouth with unwashed hands.   Prevention Steps for Caregivers and Household Members of Individuals Confirmed to have, or Being Evaluated for, COVID-19 Infection Being Cared for in the Home  If you live with, or provide care at home for, a person confirmed to have, or being evaluated for, COVID-19 infection please follow these guidelines to prevent infection:  Follow healthcare provider's instructions Make sure that you understand and can help the patient follow any healthcare provider instructions for all care.  Provide for the patient's basic needs You should help the patient with basic needs in the home and provide support for getting groceries, prescriptions, and other personal needs.  Monitor the patient's symptoms If they are getting sicker, call his or her medical provider and tell them that the patient has, or is being evaluated for, COVID-19 infection. This will help the healthcare provider's office take steps to keep other people from getting infected. Ask the healthcare provider to call the local or state health department.  Limit the number of people who have contact with the patient  If possible, have only one caregiver for the patient.  Other household members should stay in another home or place of  residence. If this is not possible, they should stay  in another room, or be separated from the patient as much as possible. Use a separate bathroom, if available.  Restrict visitors who do not have an essential need to be in the home.  Keep older adults, very young children, and other sick people away from the patient Keep older adults, very young children, and those who have compromised immune systems or chronic health conditions away from the patient.  This includes people with chronic heart, lung, or kidney conditions, diabetes, and cancer.  Ensure good ventilation Make sure that shared spaces in the home have good air flow, such as from an air conditioner or an opened window, weather permitting.  Wash your hands often  Wash your hands often and thoroughly with soap and water for at least 20 seconds. You can use an alcohol based hand sanitizer if soap and water are not available and if your hands are not visibly dirty.  Avoid touching your eyes, nose, and mouth with unwashed hands.  Use disposable paper towels to dry your hands. If not available, use dedicated cloth towels and replace them when they become wet.  Wear a facemask and gloves  Wear a disposable facemask at all times in the room and gloves when you touch or have contact with the patient's blood, body fluids, and/or secretions or excretions, such as sweat, saliva, sputum, nasal mucus, vomit, urine, or feces.  Ensure the mask fits over your nose and mouth tightly, and do not touch it during use.  Throw out disposable facemasks and gloves after using them. Do not reuse.  Wash your hands immediately after removing your facemask and gloves.  If your personal clothing becomes contaminated, carefully remove clothing and launder. Wash your hands after handling contaminated clothing.  Place all used disposable facemasks, gloves, and other waste in a lined container before disposing them with other household waste.  Remove  gloves and wash your hands immediately after handling these items.  Do not share dishes, glasses, or other household items with the patient  Avoid sharing household items. You should not share dishes, drinking glasses, cups, eating utensils, towels, bedding, or other items with a patient who is confirmed to have, or being evaluated for, COVID-19 infection.  After the person uses these items, you should wash them thoroughly with soap and water.  Wash laundry thoroughly  Immediately remove and wash clothes or bedding that have blood, body fluids, and/or secretions or excretions, such as sweat, saliva, sputum, nasal mucus, vomit, urine, or feces, on them.  Wear gloves when handling laundry from the patient.  Read and follow directions on labels of laundry or clothing items and detergent. In general, wash and dry with the warmest temperatures recommended on the label.  Clean all areas the individual has used often  Clean all touchable surfaces, such as counters, tabletops, doorknobs, bathroom fixtures, toilets, phones, keyboards, tablets, and bedside tables, every day. Also, clean any surfaces that may have blood, body fluids, and/or secretions or excretions on them.  Wear gloves when cleaning surfaces the patient has come in contact with.  Use a diluted bleach solution (e.g., dilute bleach with 1 part bleach and 10 parts water) or a household disinfectant with a label that says EPA-registered for coronaviruses. To make a bleach solution at home, add 1 tablespoon of bleach to 1 quart (4 cups) of water. For a larger supply, add  cup of bleach to 1 gallon (16 cups) of water.  Read labels of cleaning products and follow recommendations provided on product labels. Labels contain instructions for safe and effective use of the cleaning product including precautions you should take when applying the product, such as wearing gloves or eye protection and making sure you have good ventilation during use  of the product.  Remove gloves and wash hands immediately after cleaning.  Monitor yourself for signs and symptoms of illness Caregivers and household members are considered close contacts, should monitor their health,  and will be asked to limit movement outside of the home to the extent possible. Follow the monitoring steps for close contacts listed on the symptom monitoring form.   ? If you have additional questions, contact your local health department or call the epidemiologist on call at [phone] (available 24/7). ? This guidance is subject to change. For the most up-to-date guidance from [facility], please refer to their website: TripMetro.hu     Recommended Follow up Care:  Patient encouraged to follow up with the following provider within the specified time frame, or sooner as dictated by the severity of her symptoms. As always, she was instructed that for any urgent/emergent care needs, she should seek care either here or in the emergency department for more immediate evaluation.   Bailey Mech, DNP, NP-c    Bailey Mech, NP 10/27/19 1650

## 2019-10-27 NOTE — ED Triage Notes (Signed)
Patient c/o sore throat, cough, headaches and bilateral ear pain that started 4 days.  Patient denies fevers.

## 2019-10-28 LAB — NOVEL CORONAVIRUS, NAA (HOSP ORDER, SEND-OUT TO REF LAB; TAT 18-24 HRS): SARS-CoV-2, NAA: NOT DETECTED

## 2019-10-30 LAB — CULTURE, GROUP A STREP (THRC)

## 2019-11-07 IMAGING — CT CT HEAD W/O CM
3 series · 16 of 47 positions shown, 19 images · non-contrast
Comparison: 06/13/2019

CLINICAL DATA: Head trauma

EXAM:
CT HEAD WITHOUT CONTRAST
TECHNIQUE: Contiguous axial images were obtained from the base of the skull
through the vertex without intravenous contrast.

[Series 2: head wo · axial · 0.41mm/px · z∈[-97,+28]mm · 10 of 31 slices shown, 13 images]
[im 3/31  brain]
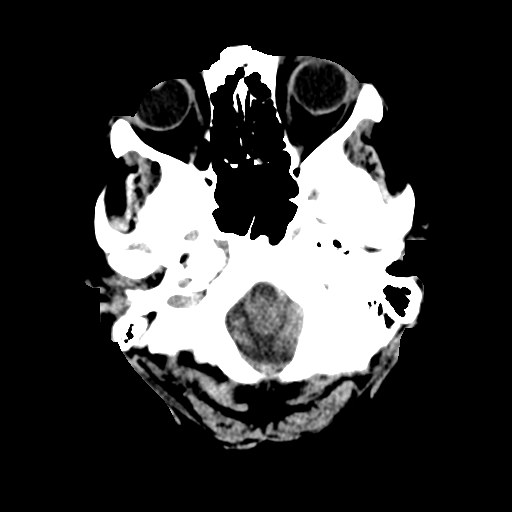
[im 3/31  bone]
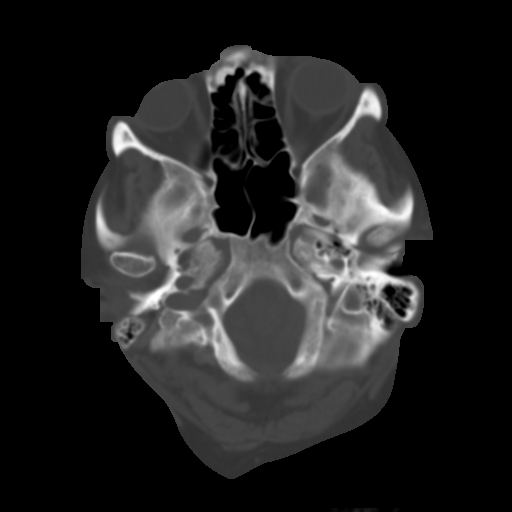
[im 6/31  brain]
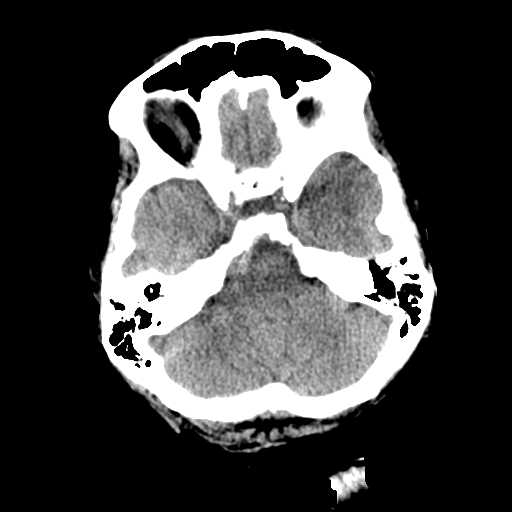
[im 9/31  brain]
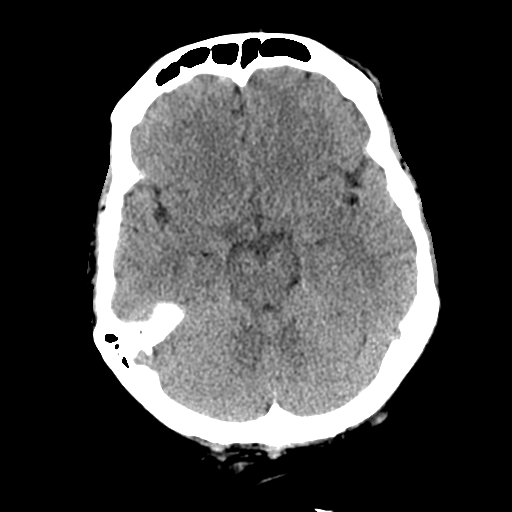
[im 11/31  brain]
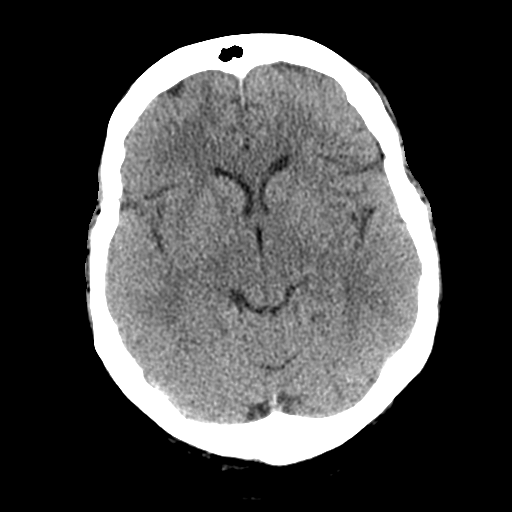
[im 14/31  brain]
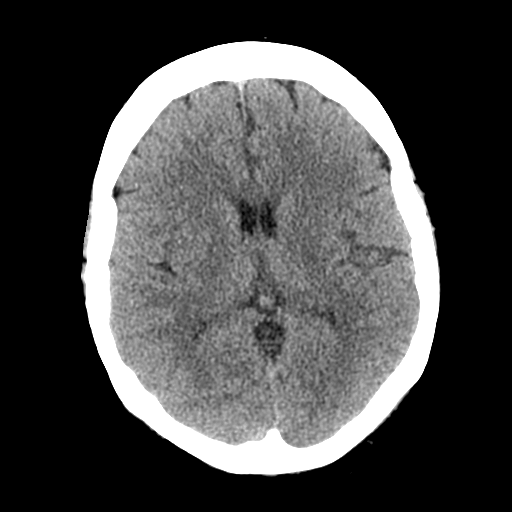
[im 14/31  bone]
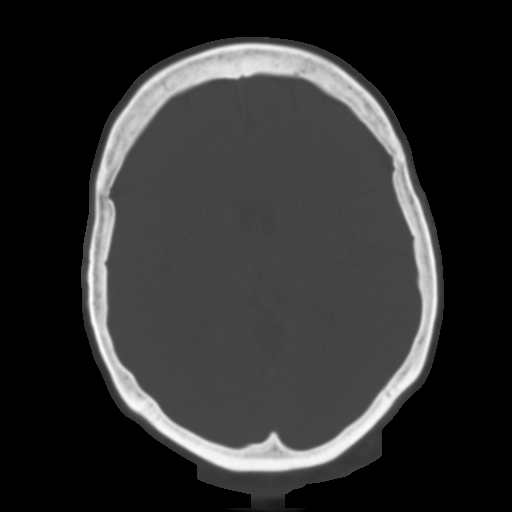
[im 17/31  brain]
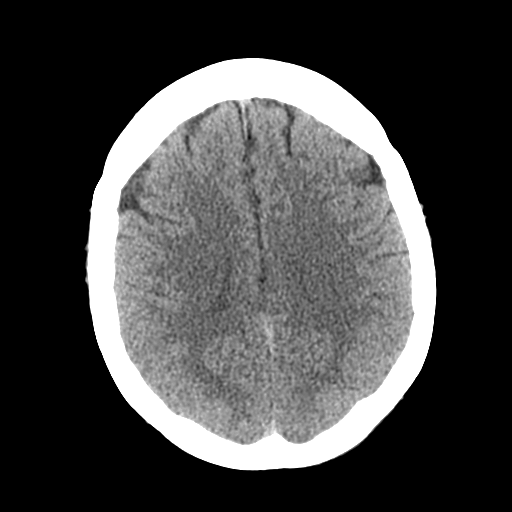
[im 20/31  brain]
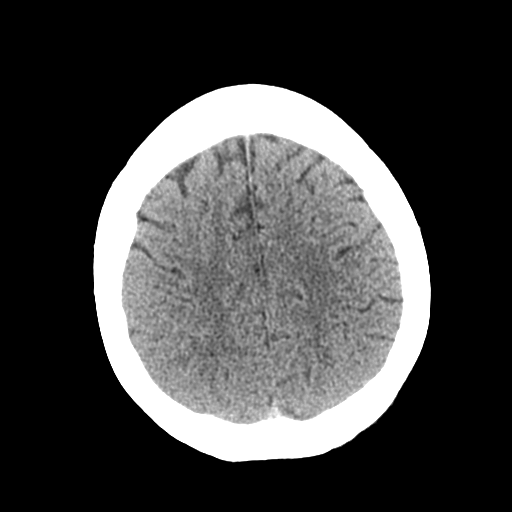
[im 23/31  brain]
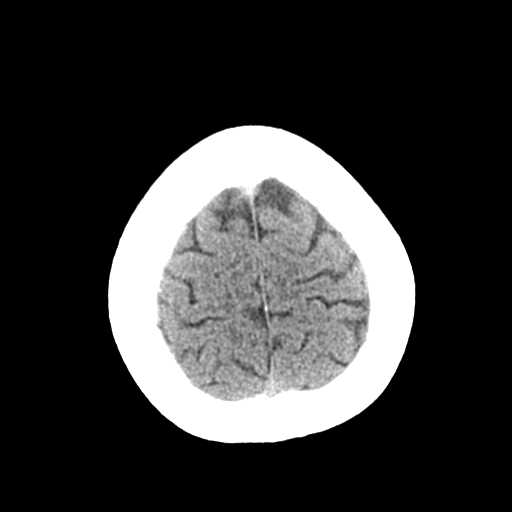
[im 25/31  brain]
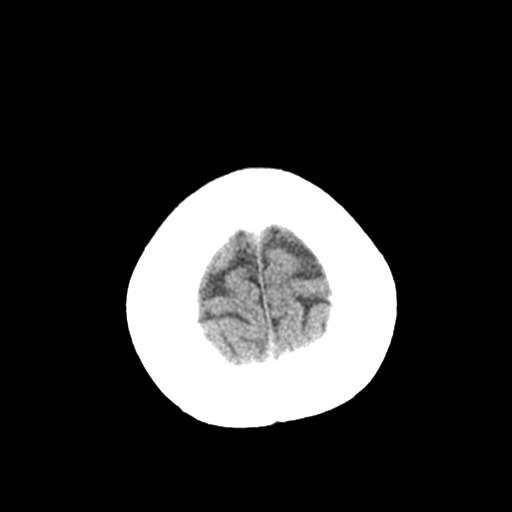
[im 25/31  bone]
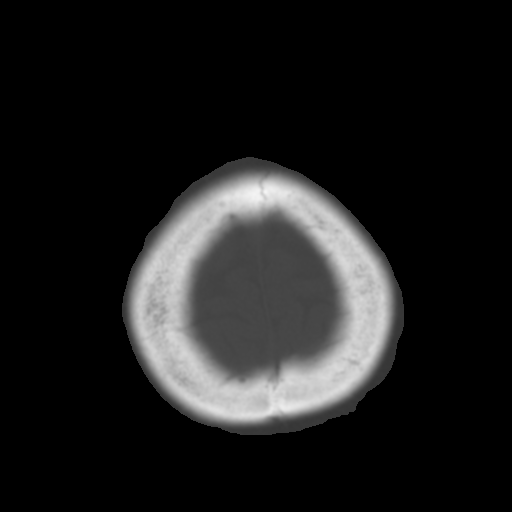
[im 28/31  brain]
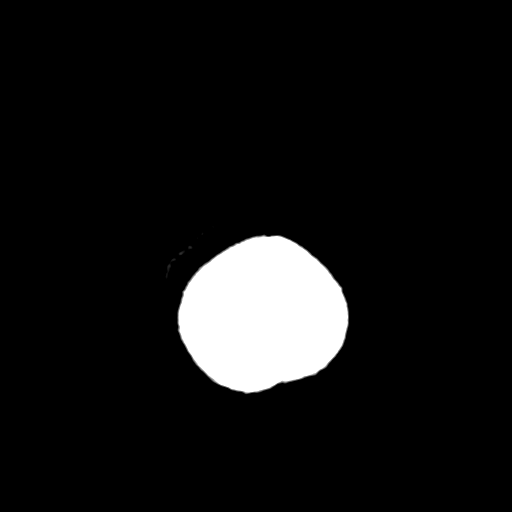

[Series 4: coronal soft tissue · coronal · 0.31mm/px · 3 of 67 slices shown]
[im 23/67  brain]
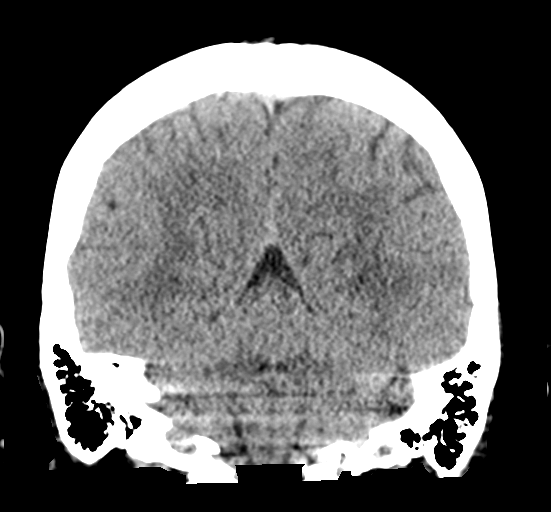
[im 30/67  brain]
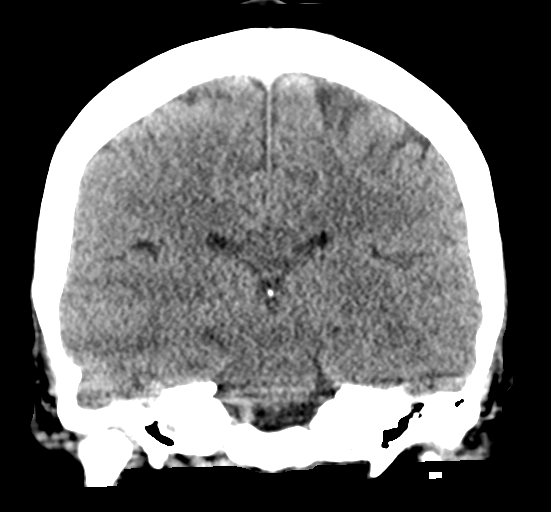
[im 37/67  brain]
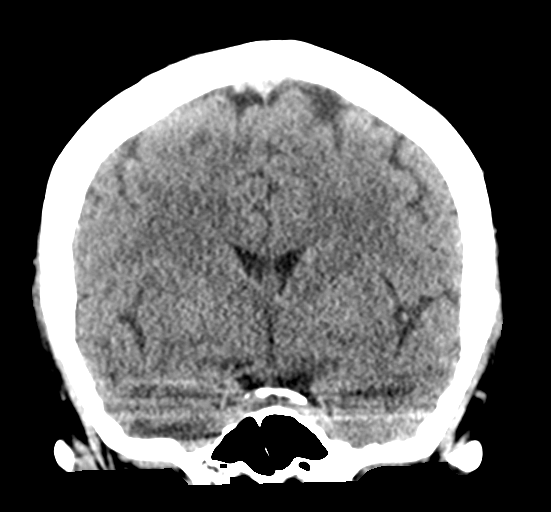

[Series 5: sagittal soft tissue · sagittal · 0.31mm/px · 3 of 57 slices shown]
[im 19/57  brain]
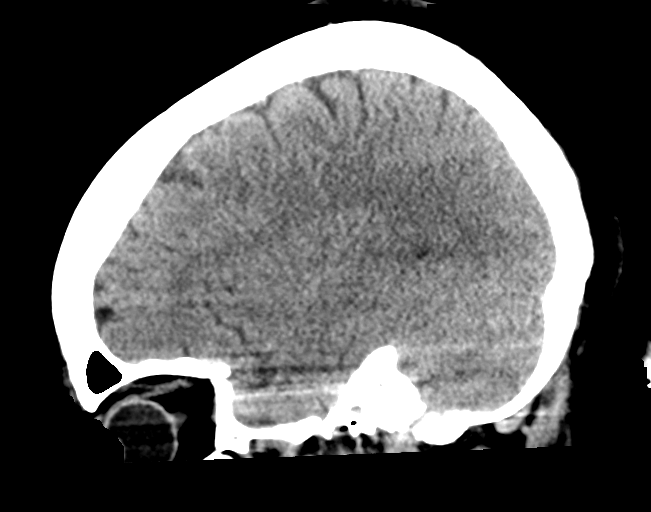
[im 29/57  brain]
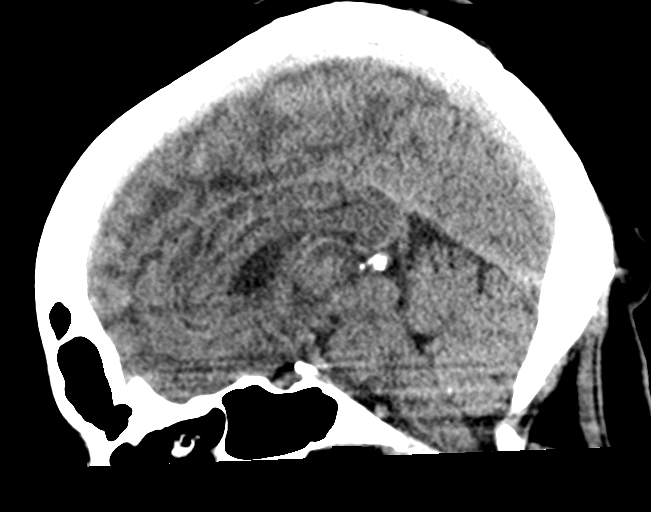
[im 38/57  brain]
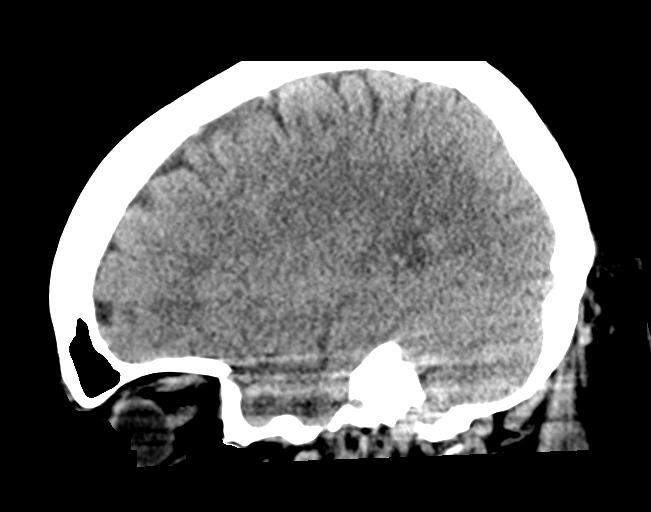

[16 of 47 positions shown; findings below may reference images not displayed]

FINDINGS: Brain: No evidence of acute infarction, hemorrhage, hydrocephalus,
extra-axial collection or mass lesion/mass effect.

Vascular: No hyperdense vessel or unexpected calcification.

Skull: Normal. Negative for fracture or focal lesion.

Sinuses/Orbits: No acute finding.

Other: None.
IMPRESSION: No acute intracranial findings.

## 2019-11-12 ENCOUNTER — Ambulatory Visit
Admit: 2019-11-12 | Discharge: 2019-11-13 | Payer: PRIVATE HEALTH INSURANCE | Attending: Student in an Organized Health Care Education/Training Program | Primary: Student in an Organized Health Care Education/Training Program

## 2019-11-12 DIAGNOSIS — Z0000 Encounter for general adult medical examination without abnormal findings: Principal | ICD-10-CM

## 2019-11-12 DIAGNOSIS — M5431 Sciatica, right side: Principal | ICD-10-CM

## 2019-11-12 DIAGNOSIS — Z131 Encounter for screening for diabetes mellitus: Principal | ICD-10-CM

## 2019-11-12 DIAGNOSIS — Z1211 Encounter for screening for malignant neoplasm of colon: Principal | ICD-10-CM

## 2019-11-12 DIAGNOSIS — J441 Chronic obstructive pulmonary disease with (acute) exacerbation: Principal | ICD-10-CM

## 2019-11-12 DIAGNOSIS — F329 Major depressive disorder, single episode, unspecified: Principal | ICD-10-CM

## 2019-11-12 MED ORDER — SPIRIVA WITH HANDIHALER 18 MCG AND INHALATION CAPSULES
ORAL_CAPSULE | Freq: Every day | RESPIRATORY_TRACT | 2 refills | 30 days | Status: CP
Start: 2019-11-12 — End: 2020-02-10

## 2019-11-12 MED ORDER — ALBUTEROL SULFATE HFA 90 MCG/ACTUATION AEROSOL INHALER
RESPIRATORY_TRACT | 0 refills | 0 days | Status: CP | PRN
Start: 2019-11-12 — End: 2020-11-11

## 2019-11-12 MED ORDER — DULOXETINE 60 MG CAPSULE,DELAYED RELEASE
ORAL_CAPSULE | Freq: Two times a day (BID) | ORAL | 3 refills | 45.00000 days | Status: CP
Start: 2019-11-12 — End: 2020-05-10

## 2019-11-12 MED ORDER — DICLOFENAC 1 % TOPICAL GEL
Freq: Four times a day (QID) | TOPICAL | 0 refills | 13 days | Status: CP
Start: 2019-11-12 — End: 2020-11-11

## 2019-11-13 DIAGNOSIS — F329 Major depressive disorder, single episode, unspecified: Principal | ICD-10-CM

## 2019-11-13 MED ORDER — DULOXETINE 60 MG CAPSULE,DELAYED RELEASE
ORAL_CAPSULE | Freq: Two times a day (BID) | ORAL | 3 refills | 0 days | Status: CP
Start: 2019-11-13 — End: ?

## 2019-11-19 ENCOUNTER — Non-Acute Institutional Stay: Admit: 2019-11-19 | Discharge: 2019-11-19 | Payer: PRIVATE HEALTH INSURANCE

## 2019-11-19 ENCOUNTER — Encounter: Admit: 2019-11-19 | Discharge: 2019-11-19 | Payer: PRIVATE HEALTH INSURANCE

## 2019-11-20 ENCOUNTER — Encounter: Admit: 2019-11-20 | Discharge: 2019-11-21 | Payer: PRIVATE HEALTH INSURANCE

## 2019-11-21 ENCOUNTER — Ambulatory Visit: Admit: 2019-11-21 | Discharge: 2019-11-22 | Payer: PRIVATE HEALTH INSURANCE

## 2019-12-03 ENCOUNTER — Encounter: Admit: 2019-12-03 | Discharge: 2019-12-03 | Payer: PRIVATE HEALTH INSURANCE

## 2019-12-09 ENCOUNTER — Encounter
Admit: 2019-12-09 | Discharge: 2019-12-10 | Payer: PRIVATE HEALTH INSURANCE | Attending: Student in an Organized Health Care Education/Training Program | Primary: Student in an Organized Health Care Education/Training Program

## 2019-12-10 DIAGNOSIS — M79642 Pain in left hand: Principal | ICD-10-CM

## 2019-12-10 DIAGNOSIS — R2241 Localized swelling, mass and lump, right lower limb: Principal | ICD-10-CM

## 2019-12-10 DIAGNOSIS — M79641 Pain in right hand: Principal | ICD-10-CM

## 2019-12-11 ENCOUNTER — Ambulatory Visit: Admit: 2019-12-11 | Discharge: 2019-12-12 | Payer: PRIVATE HEALTH INSURANCE

## 2019-12-12 DIAGNOSIS — M069 Rheumatoid arthritis, unspecified: Principal | ICD-10-CM

## 2019-12-12 MED ORDER — MELOXICAM 7.5 MG TABLET
ORAL_TABLET | 2 refills | 0 days | Status: CP
Start: 2019-12-12 — End: ?

## 2019-12-15 ENCOUNTER — Other Ambulatory Visit: Payer: Self-pay

## 2019-12-15 ENCOUNTER — Ambulatory Visit
Admission: EM | Admit: 2019-12-15 | Discharge: 2019-12-15 | Discharge status: Against Medical Advice | Payer: Medicaid Other | Attending: Family Medicine | Admitting: Family Medicine

## 2019-12-15 ENCOUNTER — Encounter: Payer: Self-pay | Admitting: Emergency Medicine

## 2019-12-15 NOTE — ED Triage Notes (Signed)
Patient had presented in a wheel chair and stated that she wanted [place] to drain the fluid in her knee and get her out of pain. Patient stated that she was in so much pain that she would rather kill herself than live like this. I just happened to see patient walking out of the exam room. I asked her if I could help her. She said no that she was just going to speak with her mother in the lobby. Patient then proceeded to walk out of the clinic. I asked patient if she was leaving without being seen and she responded with "yes, it's my right".

## 2019-12-15 NOTE — ED Triage Notes (Signed)
Patient in today c/o RLE edema and knee edema since 11/26/19. Patient saw PCP on 12/09/19 and had evaluation with ultrasound, and arthrocentesis. Patient advised to continue Ibuprofen and Tylenol. Patent called for results on 12/12/19 and was started on Meloxican. Patient has been taking Meloxicam and Ibuprofen without relief.

## 2019-12-17 ENCOUNTER — Encounter: Admit: 2019-12-17 | Discharge: 2019-12-18 | Payer: PRIVATE HEALTH INSURANCE

## 2019-12-18 MED ORDER — PREDNISONE 5 MG TABLET
ORAL_TABLET | ORAL | 0 refills | 20 days | Status: CP
Start: 2019-12-18 — End: 2020-01-07
  Filled 2019-12-18: qty 50, 20d supply, fill #0

## 2019-12-18 MED FILL — PREDNISONE 5 MG TABLET: 20 days supply | Qty: 50 | Fill #0 | Status: AC

## 2019-12-31 ENCOUNTER — Ambulatory Visit: Payer: Medicaid Other | Admitting: Occupational Therapy

## 2020-01-03 ENCOUNTER — Encounter: Admit: 2020-01-03 | Discharge: 2020-01-03 | Payer: PRIVATE HEALTH INSURANCE

## 2020-01-03 ENCOUNTER — Ambulatory Visit
Admit: 2020-01-03 | Discharge: 2020-01-03 | Payer: PRIVATE HEALTH INSURANCE | Attending: Rheumatology | Primary: Rheumatology

## 2020-01-03 DIAGNOSIS — M069 Rheumatoid arthritis, unspecified: Principal | ICD-10-CM

## 2020-01-03 MED ORDER — DICLOFENAC 1 % TOPICAL GEL
Freq: Four times a day (QID) | TOPICAL | 0 refills | 13 days | Status: CP
Start: 2020-01-03 — End: 2021-01-02

## 2020-01-03 MED ORDER — PREDNISONE 5 MG TABLET
ORAL_TABLET | ORAL | 0 refills | 56 days | Status: CP
Start: 2020-01-03 — End: 2020-02-28

## 2020-01-03 MED ORDER — MELOXICAM 7.5 MG TABLET
ORAL_TABLET | 2 refills | 0 days | Status: CP
Start: 2020-01-03 — End: ?

## 2020-01-03 MED ORDER — METHOTREXATE SODIUM 2.5 MG TABLET
ORAL_TABLET | ORAL | 3 refills | 28 days | Status: CP
Start: 2020-01-03 — End: 2020-02-02

## 2020-01-03 MED ORDER — FOLIC ACID 1 MG TABLET
ORAL_TABLET | Freq: Every day | ORAL | 11 refills | 30.00000 days | Status: CP
Start: 2020-01-03 — End: 2021-01-02

## 2020-01-06 ENCOUNTER — Ambulatory Visit: Admit: 2020-01-06 | Discharge: 2020-01-07 | Payer: PRIVATE HEALTH INSURANCE

## 2020-01-06 ENCOUNTER — Encounter: Admit: 2020-01-06 | Discharge: 2020-01-07 | Payer: PRIVATE HEALTH INSURANCE

## 2020-01-06 DIAGNOSIS — M7989 Other specified soft tissue disorders: Principal | ICD-10-CM

## 2020-01-06 DIAGNOSIS — R229 Localized swelling, mass and lump, unspecified: Principal | ICD-10-CM

## 2020-01-07 ENCOUNTER — Encounter: Admit: 2020-01-07 | Discharge: 2020-01-08 | Payer: PRIVATE HEALTH INSURANCE

## 2020-01-08 ENCOUNTER — Encounter: Payer: Medicaid Other | Admitting: Occupational Therapy

## 2020-01-08 ENCOUNTER — Ambulatory Visit: Payer: Medicaid Other | Admitting: Physical Therapy

## 2020-01-08 ENCOUNTER — Encounter: Admit: 2020-01-08 | Discharge: 2020-01-08 | Discharge status: Against Medical Advice | Payer: PRIVATE HEALTH INSURANCE

## 2020-01-08 DIAGNOSIS — M7989 Other specified soft tissue disorders: Principal | ICD-10-CM

## 2020-01-09 DIAGNOSIS — R2241 Localized swelling, mass and lump, right lower limb: Principal | ICD-10-CM

## 2020-01-13 ENCOUNTER — Ambulatory Visit: Payer: Medicaid Other | Admitting: Occupational Therapy

## 2020-01-13 ENCOUNTER — Encounter
Admit: 2020-01-13 | Discharge: 2020-01-14 | Payer: PRIVATE HEALTH INSURANCE | Attending: Student in an Organized Health Care Education/Training Program | Primary: Student in an Organized Health Care Education/Training Program

## 2020-01-13 DIAGNOSIS — M0579 Rheumatoid arthritis with rheumatoid factor of multiple sites without organ or systems involvement: Principal | ICD-10-CM

## 2020-01-13 DIAGNOSIS — R2241 Localized swelling, mass and lump, right lower limb: Principal | ICD-10-CM

## 2020-01-13 MED ORDER — TRAMADOL 50 MG TABLET
ORAL_TABLET | Freq: Three times a day (TID) | ORAL | 0 refills | 7 days | Status: CP | PRN
Start: 2020-01-13 — End: 2020-01-20

## 2020-01-15 ENCOUNTER — Encounter: Payer: Medicaid Other | Admitting: Occupational Therapy

## 2020-01-15 ENCOUNTER — Ambulatory Visit: Payer: Medicaid Other | Admitting: Physical Therapy

## 2020-01-22 ENCOUNTER — Ambulatory Visit: Payer: Medicaid Other | Admitting: Physical Therapy

## 2020-01-22 ENCOUNTER — Encounter: Payer: Medicaid Other | Admitting: Occupational Therapy

## 2020-01-22 ENCOUNTER — Encounter: Admit: 2020-01-22 | Discharge: 2020-01-23 | Payer: MEDICAID

## 2020-01-22 ENCOUNTER — Encounter: Admit: 2020-01-22 | Discharge: 2020-01-23 | Payer: PRIVATE HEALTH INSURANCE

## 2020-01-22 DIAGNOSIS — R2241 Localized swelling, mass and lump, right lower limb: Principal | ICD-10-CM

## 2020-01-22 DIAGNOSIS — S82141D Displaced bicondylar fracture of right tibia, subsequent encounter for closed fracture with routine healing: Principal | ICD-10-CM

## 2020-01-27 ENCOUNTER — Ambulatory Visit: Payer: Medicaid Other | Admitting: Physical Therapy

## 2020-01-28 ENCOUNTER — Institutional Professional Consult (permissible substitution)
Admit: 2020-01-28 | Discharge: 2020-01-29 | Payer: PRIVATE HEALTH INSURANCE | Attending: Student in an Organized Health Care Education/Training Program | Primary: Student in an Organized Health Care Education/Training Program

## 2020-01-28 MED ORDER — DULOXETINE 60 MG CAPSULE,DELAYED RELEASE
ORAL_CAPSULE | Freq: Two times a day (BID) | ORAL | 3 refills | 90 days | Status: CP
Start: 2020-01-28 — End: ?

## 2020-01-28 MED ORDER — TRAMADOL 50 MG TABLET
ORAL_TABLET | Freq: Three times a day (TID) | ORAL | 0 refills | 20 days | Status: CP | PRN
Start: 2020-01-28 — End: 2020-02-27

## 2020-04-01 ENCOUNTER — Encounter
Admit: 2020-04-01 | Discharge: 2020-04-02 | Payer: PRIVATE HEALTH INSURANCE | Attending: Student in an Organized Health Care Education/Training Program | Primary: Student in an Organized Health Care Education/Training Program

## 2020-04-01 DIAGNOSIS — F329 Major depressive disorder, single episode, unspecified: Principal | ICD-10-CM

## 2020-04-01 DIAGNOSIS — I10 Essential (primary) hypertension: Principal | ICD-10-CM

## 2020-04-01 DIAGNOSIS — M7989 Other specified soft tissue disorders: Principal | ICD-10-CM

## 2020-04-01 DIAGNOSIS — F419 Anxiety disorder, unspecified: Principal | ICD-10-CM

## 2020-04-01 DIAGNOSIS — M79675 Pain in left toe(s): Principal | ICD-10-CM

## 2020-04-01 DIAGNOSIS — M0579 Rheumatoid arthritis with rheumatoid factor of multiple sites without organ or systems involvement: Principal | ICD-10-CM

## 2020-04-01 DIAGNOSIS — R634 Abnormal weight loss: Principal | ICD-10-CM

## 2020-04-01 DIAGNOSIS — J441 Chronic obstructive pulmonary disease with (acute) exacerbation: Principal | ICD-10-CM

## 2020-04-01 DIAGNOSIS — R2241 Localized swelling, mass and lump, right lower limb: Principal | ICD-10-CM

## 2020-04-01 MED ORDER — DULOXETINE 60 MG CAPSULE,DELAYED RELEASE
ORAL_CAPSULE | Freq: Two times a day (BID) | ORAL | 3 refills | 90 days | Status: CP
Start: 2020-04-01 — End: ?

## 2020-04-01 MED ORDER — LISINOPRIL 5 MG TABLET
ORAL_TABLET | Freq: Every day | ORAL | 3 refills | 90.00000 days | Status: CP
Start: 2020-04-01 — End: 2021-04-01

## 2020-04-01 MED ORDER — MELOXICAM 7.5 MG TABLET
ORAL_TABLET | ORAL | 2 refills | 0.00000 days | Status: CP
Start: 2020-04-01 — End: ?

## 2020-04-01 MED ORDER — METHOTREXATE SODIUM 2.5 MG TABLET
ORAL_TABLET | ORAL | 11 refills | 28.00000 days | Status: CP
Start: 2020-04-01 — End: 2021-04-01

## 2020-04-01 MED ORDER — FOLIC ACID 1 MG TABLET
ORAL_TABLET | Freq: Every day | ORAL | 11 refills | 30 days | Status: CP
Start: 2020-04-01 — End: 2021-04-01

## 2020-04-01 MED ORDER — DICLOFENAC 1 % TOPICAL GEL
Freq: Four times a day (QID) | TOPICAL | 0 refills | 13.00000 days | Status: CP
Start: 2020-04-01 — End: 2021-04-01

## 2020-04-01 MED ORDER — BUSPIRONE 5 MG TABLET
ORAL_TABLET | Freq: Three times a day (TID) | ORAL | 11 refills | 30 days | Status: CP | PRN
Start: 2020-04-01 — End: 2021-04-01

## 2020-04-01 MED ORDER — INCRUSE ELLIPTA 62.5 MCG/ACTUATION POWDER FOR INHALATION
Freq: Two times a day (BID) | RESPIRATORY_TRACT | 11 refills | 30.00000 days | Status: CP
Start: 2020-04-01 — End: ?

## 2020-04-01 MED ORDER — BUPROPION HCL XL 150 MG 24 HR TABLET, EXTENDED RELEASE
ORAL_TABLET | Freq: Every morning | ORAL | 3 refills | 90 days | Status: CP
Start: 2020-04-01 — End: 2021-04-01

## 2020-04-02 DIAGNOSIS — Z5181 Encounter for therapeutic drug level monitoring: Principal | ICD-10-CM

## 2020-04-02 DIAGNOSIS — Z79899 Other long term (current) drug therapy: Principal | ICD-10-CM

## 2020-04-15 ENCOUNTER — Ambulatory Visit: Admit: 2020-04-15 | Discharge: 2020-04-16 | Payer: PRIVATE HEALTH INSURANCE

## 2020-04-24 MED ORDER — METHOTREXATE SODIUM 2.5 MG TABLET
ORAL_TABLET | ORAL | 0 refills | 28 days | Status: CP
Start: 2020-04-24 — End: 2021-04-24

## 2020-05-04 ENCOUNTER — Ambulatory Visit
Admit: 2020-05-04 | Discharge: 2020-05-05 | Payer: PRIVATE HEALTH INSURANCE | Attending: Rheumatology | Primary: Rheumatology

## 2020-05-04 DIAGNOSIS — M0579 Rheumatoid arthritis with rheumatoid factor of multiple sites without organ or systems involvement: Principal | ICD-10-CM

## 2020-05-04 MED ORDER — METHOTREXATE SODIUM 2.5 MG TABLET: 20 mg | tablet | 0 refills | 84 days | Status: AC

## 2020-05-04 MED ORDER — METHOTREXATE SODIUM 2.5 MG TABLET
ORAL_TABLET | ORAL | 0 refills | 84.00000 days | Status: CP
Start: 2020-05-04 — End: 2021-05-04

## 2020-05-04 MED ORDER — EMPTY CONTAINER
2 refills | 0 days
Start: 2020-05-04 — End: ?

## 2020-05-04 MED ORDER — ENBREL SURECLICK 50 MG/ML (1 ML) SUBCUTANEOUS PEN INJECTOR
SUBCUTANEOUS | 3 refills | 28.00000 days | Status: CP
Start: 2020-05-04 — End: ?
  Filled 2020-05-19: qty 4, 28d supply, fill #0

## 2020-05-04 MED ORDER — PREDNISONE 5 MG TABLET
ORAL_TABLET | ORAL | 0 refills | 56 days | Status: CP
Start: 2020-05-04 — End: 2020-06-29

## 2020-05-05 DIAGNOSIS — M0579 Rheumatoid arthritis with rheumatoid factor of multiple sites without organ or systems involvement: Principal | ICD-10-CM

## 2020-05-07 ENCOUNTER — Ambulatory Visit
Admit: 2020-05-07 | Discharge: 2020-05-08 | Payer: PRIVATE HEALTH INSURANCE | Attending: Student in an Organized Health Care Education/Training Program | Primary: Student in an Organized Health Care Education/Training Program

## 2020-05-07 DIAGNOSIS — R2241 Localized swelling, mass and lump, right lower limb: Principal | ICD-10-CM

## 2020-05-07 DIAGNOSIS — R634 Abnormal weight loss: Principal | ICD-10-CM

## 2020-05-07 DIAGNOSIS — Z0000 Encounter for general adult medical examination without abnormal findings: Principal | ICD-10-CM

## 2020-05-07 DIAGNOSIS — I10 Essential (primary) hypertension: Principal | ICD-10-CM

## 2020-05-07 DIAGNOSIS — F17200 Nicotine dependence, unspecified, uncomplicated: Principal | ICD-10-CM

## 2020-05-07 DIAGNOSIS — M069 Rheumatoid arthritis, unspecified: Principal | ICD-10-CM

## 2020-05-07 DIAGNOSIS — F329 Major depressive disorder, single episode, unspecified: Principal | ICD-10-CM

## 2020-05-07 MED ORDER — LISINOPRIL 5 MG TABLET
ORAL_TABLET | Freq: Every day | ORAL | 3 refills | 90 days | Status: CP
Start: 2020-05-07 — End: 2021-05-07

## 2020-05-12 NOTE — Unmapped (Signed)
Karina Zhang   Patient Onboarding/Medication Counseling    Karina Zhang is a 54 y.o. female with seropositive erosive rheumatoid arthritis who I am counseling today on initiation of therapy.  I am speaking to the patient.    Was a Nurse, learning disability used for this call? No    Verified patient's date of birth / HIPAA.    Specialty medication(s) to be sent: Inflammatory Disorders: Enbrel      Non-specialty medications/supplies to be sent: sharps kit       Enbrel (etanercept)    Medication & Administration     Dosage: Rheumatoid arthritis: Inject 50mg  under the skin one time weekly      Lab tests required prior to treatment initiation:  ??? Tuberculosis: Tuberculosis screening resulted in a non-reactive Quantiferon TB Gold assay.  ??? Hepatitis B: Hepatitis B serology studies are complete and non-reactive.      Administration:     Prefilled auto-injector pen  1. Gather all supplies needed for injection on a clean, flat working surface: medication pen removed from packaging, alcohol swab, sharps container, etc.  2. Look at the medication label ??? look for correct medication, correct dose, and check the expiration date  3. Look at the medication ??? the liquid visible in the window on the side of the pen device should appear clear and colorless; it is ok if you see small white particles in the medicine  4. Lay the auto-injector pen on a flat surface and allow it to warm up to room temperature for at least 30 minutes  5. Select injection site ??? you can use the front of your thigh or your belly (but not the area 2 inches around your belly button); if someone else is giving you the injection you can also use your upper arm in the skin covering your triceps muscle  6. Prepare injection site ??? wash your hands and clean the skin at the injection site with an alcohol swab and let it air dry, do not touch the injection site again before the injection  7. Pull off the white  safety cap, do not remove until immediately prior to injection and do not touch the needle shield  8. Pinch the skin ??? with your hand not holding the auto-injector pinch up a fold of skin at the injection site using your forefinger and thumb to prepare a firm injection site  9. Put the needle shield against your skin at the injection site at a 90 degree angle, hold the pen such that you can see the clear medication window  10. To initiate the injection unlock the purple activation button by pressing the needle shield completely in against the injection site, then depress the purple activation button to start the injection ??? you will hear a click sound  11. Continue to hold the pen firmly against your skin for about 15 seconds ??? the window will start to turn solid yellow  12. There will be a second click sound when the injection is complete, verify the window is solid yellow before pulling the pen away from your skin  13. Dispose of the used auto-injector pen immediately in your sharps disposal container the needle will be covered automatically  14. If you see any blood at the injection site, press a cotton ball or gauze on the site and maintain pressure until the bleeding stops, do not rub the injection site      Adherence/Missed dose instructions:  If your injection is given more than  2 days after your scheduled injection date ??? consult your pharmacist for additional instructions on how to adjust your dosing schedule.      Goals of Therapy     ??? Achieve symptom remission  ??? Slow disease progression  ??? Protection of remaining articular structures  ??? Maintenance of function  ??? Maintenance of effective psychosocial functioning      Side Effects & Monitoring Parameters     ??? Injection site reaction (redness, irritation, inflammation localized to the site of administration)  ??? Signs of a common cold ??? minor sore throat, runny or stuffy nose, etc.  ??? Diarrhea    The following side effects should be reported to the provider:  ??? Signs of a hypersensitivity reaction ??? rash; hives; itching; red, swollen, blistered, or peeling skin; wheezing; tightness in the chest or throat; difficulty breathing, swallowing, or talking; swelling of the mouth, face, lips, tongue, or throat; etc.  ??? Reduced immune function ??? report signs of infection such as fever; chills; body aches; very bad sore throat; ear or sinus pain; cough; more sputum or change in color of sputum; pain with passing urine; wound that will not heal, etc.  Also at a slightly higher risk of some malignancies (mainly skin and blood cancers) due to this reduced immune function.  o In the case of signs of infection ??? the patient should hold the next dose of Enbrel?? and call your primary care provider to ensure adequate medical care.  Treatment may be resumed when infection is treated and patient is asymptomatic.  ??? Signs of unexplained bruising or bleeding ??? throwing up blood or emesis that looks like coffee grounds; black, tarry, or bloody stool; etc.  ??? Changes in skin ??? a new growth or lump that forms; changes in shape, size, or color of a previous mole or marking      Contraindications, Warnings, & Precautions     ??? Have your bloodwork checked as you have been told by your prescriber  ??? Talk with your doctor if you are pregnant, planning to become pregnant, or breastfeeding  ??? Discuss the possible need for holding your dose(s) of Enbrel?? when a planned procedure is scheduled with the prescriber as it may delay healing/recovery timeline       Drug/Food Interactions     ??? Medication list reviewed in Epic. The patient was instructed to inform the care team before taking any new medications or supplements. No drug interactions identified.   ??? If you have a latex allergy use caution when handling; the needle cap of the Enbrel?? prefilled syringe, the safety cap for the Enbrel SureClick?? pen, and the needle cover within the purple cap of the Enbrel Mini?? cartridge contains a derivative of natural rubber latex  ??? Talk with you prescriber or pharmacist before receiving any live vaccinations while taking this medication and after you stop taking it    Storage, Handling Precautions, & Disposal     ??? Store this medication in the refrigerator.  Do not freeze  ??? If needed, you may store at room temperature for up to 14 days  ??? Store in original packaging, protected from light  ??? Do not shake  ??? Dispose of used syringes/pens/cartridges in a sharps disposal container      Current Medications (including OTC/herbals), Comorbidities and Allergies     Current Outpatient Medications   Medication Sig Dispense Refill   ??? albuterol HFA 90 mcg/actuation inhaler Inhale 2 puffs every four (4) hours as needed  for wheezing. 1 Inhaler 0   ??? buPROPion (WELLBUTRIN XL) 150 MG 24 hr tablet Take 1 tablet (150 mg total) by mouth every morning. 90 tablet 3   ??? busPIRone (BUSPAR) 5 MG tablet Take 1 tablet (5 mg total) by mouth Three (3) times a day as needed (anxiety). 90 tablet 11   ??? diclofenac sodium (VOLTAREN) 1 % gel Apply 2 g topically Four (4) times a day. 100 g 0   ??? DULoxetine (CYMBALTA) 60 MG capsule Take 1 capsule (60 mg total) by mouth Two (2) times a day. 180 capsule 3   ??? ENBREL SURECLICK 50 MG/ML (0.98 ML) SUBCUTANEOUS PEN Inject the contents of 1 pen (50 mg total) under the skin every seven (7) days. 4 mL 3   ??? folic acid (FOLVITE) 1 MG tablet Take 1 tablet (1 mg total) by mouth daily. 30 tablet 11   ??? lisinopriL (PRINIVIL,ZESTRIL) 5 MG tablet Take 1.5 tablets (7.5 mg total) by mouth daily. 135 tablet 3   ??? meloxicam (MOBIC) 7.5 MG tablet Start with 1 tablet daily.  If not improved in 3 days, take 2 a day. 60 tablet 2   ??? methotrexate 2.5 MG tablet Take 8 tablets (20 mg total) by mouth once a week. 96 tablet 0   ??? predniSONE (DELTASONE) 5 MG tablet Take 4 tablets (20 mg total) by mouth daily for 14 days, THEN 3 tablets (15 mg total) daily for 14 days, THEN 2 tablets (10 mg total) daily for 14 days, THEN 1 tablet (5 mg total) daily for 14 days. 140 tablet 0   ??? umeclidinium (INCRUSE ELLIPTA) 62.5 mcg/actuation inhaler Inhale 1 puff two (2) times a day. 60 each 11     No current facility-administered medications for this visit.       Allergies   Allergen Reactions   ??? Amitriptyline Shortness Of Breath and Itching   ??? Codeine Itching and Nausea And Vomiting       Patient Active Problem List   Diagnosis   ??? Chronic obstructive pulmonary disease with acute exacerbation (CMS-HCC)   ??? Asthma   ??? Back pain with right-sided sciatica   ??? Tobacco dependence   ??? Rheumatoid arthritis (CMS-HCC)   ??? Depression   ??? Hypertension   ??? Anxiety   ??? Unintentional weight loss   ??? Mass of right knee       Reviewed and up to date in Epic.    Appropriateness of Therapy     Is medication and dose appropriate based on diagnosis? Yes    Prescription has been clinically reviewed: Yes    Baseline Quality of Life Assessment      Rheumatology:   Quality of Life    On a scale of 1 ??? 10 with 1 representing not at all and 10 representing completely ??? how has your rheumatologic condition affected your:         Financial Information     Medication Assistance provided: Prior Authorization    Anticipated copay of $3.00 reviewed with patient. Verified delivery address.    Delivery Information     Scheduled delivery date: 05/19/2020    Expected start date: 05/20/2020    Medication will be delivered via Same Day Courier to the prescription address in [facility]  This shipment will not require a signature.      Explained the services we provide at [facility] and that each month we would call to set up refills.  Stressed  importance of returning phone calls so that we could ensure they receive their medications in time each month.  Informed patient that we should be setting up refills 7-10 days prior to when they will run out of medication.  A pharmacist will reach out to perform a clinical assessment periodically.  Informed patient that a welcome packet and a drug information handout will be sent.      Patient verbalized understanding of the above information as well as how to contact the [facility] at [phone] option 4 with any questions/concerns.  The [facility] is open Monday through Friday 8:30am-4:30pm.  A pharmacist is available 24/7 via pager to answer any clinical questions they may have.    Patient Specific Needs     - Does the patient have any physical, cognitive, or cultural barriers? No    - Patient prefers to have medications discussed with  Patient     - Is the patient or caregiver able to read and understand education materials at a high school level or above? Yes    - Patient's primary language is  English     - Is the patient high risk? No     - Does the patient require a Care Management Plan? No     - Does the patient require physician intervention or other additional services (i.e. nutrition, smoking cessation, social work)? No      Karene Fry Raife Lizer  [facility] [employer] [facility] Specialty Pharmacist

## 2020-05-12 NOTE — Unmapped (Signed)
[facility] Specialty Medication Onboarding    Specialty Medication: ENBREL SURECLICK PENS 50MG /ML  Prior Authorization: Approved   Financial Assistance: No - copay  <$25  Final Copay/Day Supply: $3 / 28 DAYS    Insurance Restrictions: Yes - max 1 month supply     Notes to Pharmacist:     The triage team has completed the benefits investigation and has determined that the patient is able to fill this medication at [facility]. Please contact the patient to complete the onboarding or follow up with the prescribing physician as needed.

## 2020-05-13 NOTE — Unmapped (Signed)
Referral appointment
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
## 2020-05-19 MED FILL — EMPTY CONTAINER: 120 days supply | Qty: 1 | Fill #0

## 2020-05-19 MED FILL — EMPTY CONTAINER: 120 days supply | Qty: 1 | Fill #0 | Status: AC

## 2020-05-19 MED FILL — ENBREL SURECLICK 50 MG/ML (1 ML) SUBCUTANEOUS PEN INJECTOR: 28 days supply | Qty: 4 | Fill #0 | Status: AC
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
## 2020-05-27 NOTE — Unmapped (Signed)
Referral appointment appointment request

## 2020-05-29 NOTE — Unmapped (Signed)
Referral appointment request

## 2020-06-01 NOTE — Unmapped (Signed)
called to scheduled referral appointment

## 2020-06-12 NOTE — Unmapped (Signed)
The [facility] has made a third and final attempt to reach this patient to refill the following medication: Enbrel 50mg /ml.      We have left voicemails on the following phone numbers: [phone]; [phone], have been unable to leave messages on the following phone numbers: [phone]; [phone] and have sent a MyChart message.    Dates contacted: 06/03/2020; 06/09/2020; 06/12/2020  Last scheduled delivery: 05/19/2020    The patient may be at risk of non-compliance with this medication. The patient should call the [facility] at [phone] (option 4) to refill medication.    Karene Fry Meghanne Pletz   [facility] [employer] [facility] Specialty Pharmacist

## 2020-06-16 NOTE — Unmapped (Signed)
1x attempt per in basket.    Need to schedule pain & mood follow up with PCP.     Left voicemail.    Karina Zhang  06/16/20

## 2020-06-17 NOTE — Unmapped (Signed)
The [facility] has made a fourth and final attempt to reach this patient to refill the following medication: Enbrel 50 mg/ml.      We have left voicemails on the following phone numbers: [phone]; [phone], have been unable to leave messages on the following phone numbers: [phone] and have sent a MyChart message.    Dates contacted: 06/03/2020; 06/09/2020; 06/12/2020; 06/17/2020  Last scheduled delivery: 05/19/2020    The patient may be at risk of non-compliance with this medication. The patient should call the [facility] at [phone] (option 4) to refill medication.    Karene Fry Medha Pippen   [facility] [employer] [facility] Specialty Pharmacist

## 2020-06-17 NOTE — Unmapped (Signed)
[facility] Clinical Assessment & Refill Coordination Note    Karina Zhang, DOB: 1966/05/27  Phone: [phone] (home) [phone] (work)    All above HIPAA information was verified with patient.     Was a Nurse, learning disability used for this call? No    Specialty Medication(s):   Inflammatory Disorders: Enbrel     Current Outpatient Medications   Medication Sig Dispense Refill   ??? albuterol HFA 90 mcg/actuation inhaler Inhale 2 puffs every four (4) hours as needed for wheezing. 1 Inhaler 0   ??? buPROPion (WELLBUTRIN XL) 150 MG 24 hr tablet Take 1 tablet (150 mg total) by mouth every morning. 90 tablet 3   ??? busPIRone (BUSPAR) 5 MG tablet Take 1 tablet (5 mg total) by mouth Three (3) times a day as needed (anxiety). 90 tablet 11   ??? diclofenac sodium (VOLTAREN) 1 % gel Apply 2 g topically Four (4) times a day. 100 g 0   ??? DULoxetine (CYMBALTA) 60 MG capsule Take 1 capsule (60 mg total) by mouth Two (2) times a day. 180 capsule 3   ??? empty container Misc Use as directed 1 each 2   ??? ENBREL SURECLICK 50 MG/ML (0.98 ML) SUBCUTANEOUS PEN Inject the contents of 1 pen (50mg ) under the skin every 7 days 4 mL 3   ??? folic acid (FOLVITE) 1 MG tablet Take 1 tablet (1 mg total) by mouth daily. 30 tablet 11   ??? lisinopriL (PRINIVIL,ZESTRIL) 5 MG tablet Take 1.5 tablets (7.5 mg total) by mouth daily. 135 tablet 3   ??? meloxicam (MOBIC) 7.5 MG tablet Start with 1 tablet daily.  If not improved in 3 days, take 2 a day. 60 tablet 2   ??? methotrexate 2.5 MG tablet Take 8 tablets (20 mg total) by mouth once a week. 96 tablet 0   ??? predniSONE (DELTASONE) 5 MG tablet Take 4 tablets (20 mg total) by mouth daily for 14 days, THEN 3 tablets (15 mg total) daily for 14 days, THEN 2 tablets (10 mg total) daily for 14 days, THEN 1 tablet (5 mg total) daily for 14 days. 140 tablet 0   ??? umeclidinium (INCRUSE ELLIPTA) 62.5 mcg/actuation inhaler Inhale 1 puff two (2) times a day. 60 each 11     No current facility-administered medications for this visit.        Changes to medications: [name] reports no changes at this time.    Allergies   Allergen Reactions   ??? Amitriptyline Shortness Of Breath and Itching   ??? Codeine Itching and Nausea And Vomiting       Changes to allergies: No    SPECIALTY MEDICATION ADHERENCE     Enbrel 50 mg/ml: 0 days of medicine on hand     Medication Adherence    Patient reported X missed doses in the last month: 0  Specialty Medication: Enbrel 50 mg pen  Patient is on additional specialty medications: No  Informant: patient  Confirmed plan for next specialty medication refill: delivery by pharmacy          Specialty medication(s) dose(s) confirmed: Regimen is correct and unchanged.     Are there any concerns with adherence? No    Adherence counseling provided? Not needed    CLINICAL MANAGEMENT AND INTERVENTION      Clinical Benefit Assessment:    Do you feel the medicine is effective or helping your condition? Yes    Clinical Benefit counseling provided? Not needed  Adverse Effects Assessment:    Are you experiencing any side effects? No    Are you experiencing difficulty administering your medicine? No    Quality of Life Assessment:    How many days over the past month did your Rheumatoid arthritis involving multiple sites wiht positive rheumatoid factor  keep you from your normal activities? For example, brushing your teeth or getting up in the morning. 0    Have you discussed this with your provider? Not needed    Therapy Appropriateness:    Is therapy appropriate? Yes, therapy is appropriate and should be continued    DISEASE/MEDICATION-SPECIFIC INFORMATION      For patients on injectable medications: Patient currently has 0 doses left.  Next injection is scheduled for 06/17/20.    PATIENT SPECIFIC NEEDS     - Does the patient have any physical, cognitive, or cultural barriers? No    - Is the patient high risk? No     - Does the patient require a Care Management Plan? No     - Does the patient require physician intervention or other additional services (i.e. nutrition, smoking cessation, social work)? No      SHIPPING     Specialty Medication(s) to be Shipped:   Inflammatory Disorders: Enbrel    Other medication(s) to be shipped: none     Changes to insurance: No    Delivery Scheduled: Yes, Expected medication delivery date: 06/18/20.     Medication will be delivered via Same Day Courier to the confirmed prescription address in [facility].    The patient will receive a drug information handout for each medication shipped and additional FDA Medication Guides as required.  Verified that patient has previously received a Conservation officer, historic buildings.    All of the patient's questions and concerns have been addressed.    Breck Coons Shared [facility] Specialty Pharmacist

## 2020-06-18 MED FILL — ENBREL SURECLICK 50 MG/ML (1 ML) SUBCUTANEOUS PEN INJECTOR: SUBCUTANEOUS | 28 days supply | Qty: 4 | Fill #1

## 2020-06-18 MED FILL — ENBREL SURECLICK 50 MG/ML (1 ML) SUBCUTANEOUS PEN INJECTOR: 28 days supply | Qty: 4 | Fill #1 | Status: AC

## 2020-06-25 NOTE — Unmapped (Incomplete)
It was a pleasure taking care of you today!    1.     2. Please call [facility] at the number listed below to find a mental health provider close to your home    3.           How to Find Mental Health Treatment   If you have health insurance, look on your insurance card for the phone number or website to access a list of mental health providers who accept your insurance.      If you have Medicaid, Medicare or no insurance, see below for your county's MCO. Call the phone number to schedule an appointment.     [facility]  Phone: [phone]  Website: [email or web address]    [facility])  [address] [place], [place]  Phone: [phone]  Hours: Sun-Sat 8am-8pm      ==============================================================================================    Suicide & Crisis Hotlines   [facility]   Phone: 863-232-6858    Online chat: www.hopeline.com    [place]   Phone: 1-800-SUICIDE (562-368-1349)    National Suicide Prevention Lifeline    Phone: 4-401-027-OZDG (260-371-2521)   Online chat: www.suicidepreventionlifeline.org    Online chat (ages 55-24): www.teentalklinenc.org
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
## 2020-06-26 ENCOUNTER — Telehealth
Admit: 2020-06-26 | Discharge: 2020-06-27 | Payer: PRIVATE HEALTH INSURANCE | Attending: Student in an Organized Health Care Education/Training Program | Primary: Student in an Organized Health Care Education/Training Program

## 2020-06-26 NOTE — Unmapped (Signed)
Provider called and spoke with pt herself.

## 2020-06-28 NOTE — Unmapped (Signed)
Addended by: Francisco Capuchin on: 06/28/2020 04:11 PM     Modules accepted: Level of Service

## 2020-07-01 NOTE — Unmapped (Signed)
Left voicemail. Need to schedule 1 month follow up with PCP or available resident F2F.    Follow-up disposition: Return in about 1 month (around 07/27/2020) for F/u knee pain.   Check out comments: Schedule only with Dr. Petra Kuba on 07/27/20, in person please     Karina Zhang  07/01/20

## 2020-07-03 NOTE — Unmapped (Signed)
[facility] Refill Coordination Note    Specialty Medication(s) to be Shipped:   Inflammatory Disorders: Enbrel    Other medication(s) to be shipped: n/a     Truett Mainland, DOB: 07-Jul-1966  Phone: [phone] (home) [phone] (work)      All above HIPAA information was verified with patient.     Was a Nurse, learning disability used for this call? No    Completed refill call assessment today to schedule patient's medication shipment from the [facility] [phone]).       Specialty medication(s) and dose(s) confirmed: Regimen is correct and unchanged.   Changes to medications: Jashley reports no changes at this time.  Changes to insurance: No  Questions for the pharmacist: No    Confirmed patient received Welcome Packet with first shipment. The patient will receive a drug information handout for each medication shipped and additional FDA Medication Guides as required.       DISEASE/MEDICATION-SPECIFIC INFORMATION        For patients on injectable medications: Patient currently has 1 doses left.  Next injection is scheduled for 7/29.    SPECIALTY MEDICATION ADHERENCE     Medication Adherence    Patient reported X missed doses in the last month: 0  Specialty Medication: Enbrel  Patient is on additional specialty medications: No  Patient is on more than two specialty medications: No  Any gaps in refill history greater than 2 weeks in the last 3 months: no  Demonstrates understanding of importance of adherence: yes  Informant: patient                Enbrel 50mg /ml: Patient has 7 days of medication on hand      SHIPPING     Shipping address confirmed in Epic.     Delivery Scheduled: Yes, Expected medication delivery date: 8/3.     Medication will be delivered via Same Day Courier to the prescription address in Epic W[facility].    [name]   [facility] Specialty Technician

## 2020-07-05 NOTE — Unmapped (Incomplete)
RHEUMATOLOGY CLINIC FOLLOW-UP NOTE      Primary Care Provider: Villa Herb, MD    HPI:  Karina Zhang is a 54 y.o.  female with a past medical history of COPD on inhalers, tobacco use, prior history of PE who presented to the hospital 12/17/2019 for 66-month history of multiple joint pains, starting with right 2nd toe, then right and left ankle, then right knee, and then with pain everywhere including hands. Also reported all-day joint stiffness. Underwent work-up for infectious etiology of symptoms with knee aspiration by internal medicine 12/28 with effusion that seemed inflammatory in nature, no crystals present. Found to have elevated RF and anti-CCP, small erosion seen on ulna styloid on bilateral hand X-Rays. She was given prednisone taper with significant joint improvement.   I saw her in clinic last 05/04/2020; she continued to have right knee pain and swelling, MRI showed effusion with synovitis and large popliteal cyst. She was having morning stiffness and swelling in her hands, was on Wellbutrin and buspirone for mood symptoms. No rashes or GI symptoms, reported sicca symptoms. Taking 80 mg ibuprofen 2-3 times daily for knee and back pain without relief. Methotrexate 20 mg weekly started, prednisone taper given, started on Enbrel and asked to RTC 2 months.     Since then:  -saw PCP 5/27: referred to psychiatry for depression/anxiety, on Cymbalta, Wellbutrin, and Buspar   -our pharmacy team has been trying to get in contact with her to fill and ship Enbrel, but unable to reach her. Last scheduled delivery 05/19/2020, then able to reach her  -PCP confirmed with Dr. Domingo Cocking in orthopedics that no biopsy required of area on MRI.  -when she saw PCP 7/16, she reported that her symptoms were improved since adding Enbrel 50 mg weekly and increasing methotrexate dose. MRI 5/5 showed  New large right lateral femoral condyle osteochondral lesion and unclear if related to RA, dedicated knee imaging recommended by radiology ordered by PCP    Disease History:  She was first seen in rheumatology clinic 12/2019, started on methotrexate for seropositive, erosive rheumatoid arthritis. At that visit, she continued to have one-month history of right leg pain and swelling below the knee, could not put weight on that leg. Had bilateral shoulder pain, was going to start PT soon. Given Pneumovax vaccine.   Since then, had PFTs that showed mild obstructive ventilatory defect. Went to PCP and had repeat PVL DVT US that was negative for DVT, but showed cystic structure in right popliteal fossa extending in to the calf. Patient was very tearful due to increased pain and swelling, at risk for compartment syndrome and asked to go to ED. MRI lower extremity showed large T2 hyperintense/T1 hypointense multiloculated lesion within posterior leg compartment with mass effect on gastrocnemius musculature, some areas concerning for hemorrhage, also with nondisplaced right lateral tibial plateau fracture. Patient left the ER before MRI results discussed and she wanted to follow-up as outpatient with PCP. Seen by PCP 2/1 and given tramadol, referred to orthopedics. Saw orthopedic surgery with plan to do contrasted repeat MRI studies.   Went to see PCP in 03/2020 due to depression and anxiety from ongoing leg pain, death of her son, recent divorce. Started on Wellbutrin XR 150 mg daily, buspirone 5 mg TID PRN and asked to reschedule missed MRIs for 04/15/2020 at [facility].   Had MRI of right lower extremity that showed large right popliteal cyst (prior imaging thought to be likely 2/2 internal hemorrhage), moderate right knee effusion with synovitis,  new large right lateral femoral condyle osteochondral lesion. No-showed follow-up visit with orthopedics.       Review of Systems:  Positive findings noted above, otherwise a 14 point review of systems was reviewed and negative    Past Medical, Surgical, Family and Social History reviewed and updated per EMR     Allergies:  Amitriptyline and Codeine    Medications:     Current Outpatient Medications:   ???  albuterol HFA 90 mcg/actuation inhaler, Inhale 2 puffs every four (4) hours as needed for wheezing., Disp: 1 Inhaler, Rfl: 0  ???  buPROPion (WELLBUTRIN XL) 150 MG 24 hr tablet, Take 1 tablet (150 mg total) by mouth every morning., Disp: 90 tablet, Rfl: 3  ???  busPIRone (BUSPAR) 5 MG tablet, Take 1 tablet (5 mg total) by mouth Three (3) times a day as needed (anxiety)., Disp: 90 tablet, Rfl: 11  ???  diclofenac sodium (VOLTAREN) 1 % gel, Apply 2 g topically Four (4) times a day., Disp: 100 g, Rfl: 0  ???  DULoxetine (CYMBALTA) 60 MG capsule, Take 1 capsule (60 mg total) by mouth Two (2) times a day., Disp: 180 capsule, Rfl: 3  ???  empty container Misc, Use as directed, Disp: 1 each, Rfl: 2  ???  ENBREL SURECLICK 50 MG/ML (0.98 ML) SUBCUTANEOUS PEN, Inject the contents of 1 pen (50mg ) under the skin every 7 days, Disp: 4 mL, Rfl: 3  ???  folic acid (FOLVITE) 1 MG tablet, Take 1 tablet (1 mg total) by mouth daily., Disp: 30 tablet, Rfl: 11  ???  lisinopriL (PRINIVIL,ZESTRIL) 5 MG tablet, Take 1.5 tablets (7.5 mg total) by mouth daily., Disp: 135 tablet, Rfl: 3  ???  meloxicam (MOBIC) 7.5 MG tablet, Start with 1 tablet daily.  If not improved in 3 days, take 2 a day., Disp: 60 tablet, Rfl: 2  ???  methotrexate 2.5 MG tablet, Take 8 tablets (20 mg total) by mouth once a week., Disp: 96 tablet, Rfl: 0  ???  umeclidinium (INCRUSE ELLIPTA) 62.5 mcg/actuation inhaler, Inhale 1 puff two (2) times a day., Disp: 60 each, Rfl: 11      Objective   There were no vitals filed for this visit.    Physical Exam  General: well appearing, no acute distress, wearing a mask, difficulty with gait due to right leg pain and swelling, using a walker  Eyes: EOMI, normal conjunctivae   ENT: MMM.  Oropharynx without any erythema or exudate.  No oral or nasal ulcers. Has dental bridge on top, several missing teeth and poor dentition of bottom teeth  Neck: supple. No cervical lymphadenopathy  Cardiovascular: Regular rate and rhythm. No murmurs, rubs or gallops.   Pulmonary: Clear to auscultation bilaterally. Normal work of breathing.  Skin: no rash, lesions, breakdown. No purpura or petechiae. No digital ulcers.   Extremities: Pitting edema noted of the right lower extremity until the knee. DP pulses felt bilaterally. Good ROM of ankles bilaterally.   Musculoskeletal: Swelling and and tenderness over several PIPs and MCPs on both hands. Difficult to make strong fist bilaterally.  Full ROM of shoulders bilaterally. Right knee significantly swollen versus left with effusion, warmth.   Neurologic: Cranial nerves grossly intact, strength 5/5 in bilateral upper extremities.   Psychiatric: Depressed mood     Labs/Imaging:  Lab Results   Component Value Date    WBC 6.5 05/04/2020    RBC 4.65 05/04/2020    HGB 13.9 05/04/2020    HCT 42.8 05/04/2020  MCV 92.0 05/04/2020    MCH 29.9 05/04/2020    MCHC 32.5 05/04/2020    RDW 15.2 (H) 05/04/2020    PLT 417 05/04/2020    MPV 8.8 05/04/2020       Chemistry        Component Value Date/Time    NA 139 05/04/2020 1700    K 3.5 05/04/2020 1700    CL 106 05/04/2020 1700    CO2 24.0 05/04/2020 1700    BUN 11 05/04/2020 1700    CREATININE 0.72 05/04/2020 1700    GLU 93 05/04/2020 1700        Component Value Date/Time    CALCIUM 8.9 05/04/2020 1700    ALKPHOS 107 05/04/2020 1700    AST 12 05/04/2020 1700    ALT <7 (L) 05/04/2020 1700    BILITOT 0.2 (L) 05/04/2020 1700        ANA negative  ENA negative  ANCA negative  C3 125, C4 29.5  RF 105, CCP positive    Joint aspiration: 19K nucleated cells, no crystals, macrophages present    HBsAg negative, HBsAb negative  Hep C Ab reactive, HCV RNA negative  Quant TB negative   RPR negative     Bilateral Hand X-Rays, 12/17/2019:  IMPRESSION:  --Small erosion at the tip of the right ulna styloid process.  No other erosion or joint narrowing.Marland Kitchen  ??  --Bilateral mild negative ulnar variance.    CXR, 12/17/2019: IMPRESSION:  ??  Clear lungs.      MRI Right LE, 04/2020:  1. Large right popliteal cyst. The appearance on prior MRI was likely due to internal hemorrhage (hemorrhagic popliteal cyst), which has now resolved.  ??  2. Moderate right knee effusion with synovitis.  ??  3. New large right lateral femoral condyle osteochondral lesion.  Extensive lateral femoral condyle, new in the interval, and tibial plateau bone marrow edema suggest contusions or progressive diffuse cartilage disease. Further evaluation with dedicated routine knee MR without contrast recommended.      Assessment/Plan:    Karina Zhang is a 54 y.o.  female with a past medical history of COPD on inhalers, tobacco use, prior history of PE who presented to the hospital 12/17/2019 for 67-month history of multiple joint pains, starting with right 2nd toe, then right and left ankle, then right knee, and now with pain everywhere including hands. Also reported all-day joint stiffness. Underwent work-up for infectious etiology of symptoms with knee aspiration by internal medicine 12/28 with effusion that seemed inflammatory in nature, no crystals present. Found to have elevated RF and anti-CCP, small erosion seen on ulna styloid on bilateral hand X-Rays. She was given prednisone taper with some joint improvement. She was initiated on methotrexate 15 mg at first clinic visit a few months ago for seropositive, erosive RA. She did not get follow up labs, so ended up not increasing to 20 mg methotrexate. In the interim, had persistent right knee pain and swelling, MRI showed originally a ?internal hemorrhage, now resolved and now effusion with synovitis, likely related to severe, poorly-controlled rheumatoid arthritis.  Today, we will increase methotrexate to 20 mg weekly (and get monitoring labs). We will also initiate therapy with TNF inhibitor Enbrel weekly given her persistent symptoms of joint stiffness, swelling, pain. Additionally, we will provide 8-week prednisone taper to hopefully provide some more joint pain relief.     Plan:  1. Seropositive erosive rheumatoid arthritis:  Inflammation and swelling noted of joints today, with severe pain and swelling of  hand PIP joints and right knee. Using walker to ambulate and is in significant pain.   -prednisone taper: 20 mg x 2 weeks, 15 mg x 2 weeks, 10 mg x 2 weeks, 5 mg x 2 weeks, then off  -hold off on aspiration + injection today of knee given that without control of RA, symptoms will return quickly  -increase methotrexate to 20 mg weekly today with daily folic acid, refilled  -labs today for monitoring: CBC with diff, CMP  -start Enbrel 50 mg weekly today for additional RA control     Health Maintenance  Routine health maintenance discussed      - CDC recommends all immunosuppressed adults receive vaccination against pneumonococcus.. Adults 19 years or older who have not received any pneumococcal vaccine, should get a dose of PCV13 first and should also continue to receive the recommended doses of PPSV23. Adults 19 years or older who have previously received one or more doses of PPSV23, should also receive a dose of PCV13 and should continue to receive the remaining recommended doses of PPSV23.  .  Immunization History   Administered Date(s) Administered   ??? Influenza Vaccine Quad (IIV4 PF) 49mo+ injectable 11/12/2019   ??? PNEUMOCOCCAL POLYSACCHARIDE 23 07/09/2015, 01/03/2020   ??? TdaP 07/09/2015, 09/25/2018   - received PPSV23 12/2019. Will plan to give PCV13 in one year and then PPSV23 8+ weeks later.        - DEXA Scan: can consider at next visit      - Vitamin D: asked her to check multivitamin to see how much vitamin D and calcium provided       Patient was seen and discussed with attending physician, Dr.     RTC 2 months for follow-up     Tommy Rainwater, MD, PGY-5  [facility]  [phone]  Pager: [phone]

## 2020-07-06 ENCOUNTER — Non-Acute Institutional Stay: Admit: 2020-07-06 | Payer: PRIVATE HEALTH INSURANCE | Attending: Rheumatology | Primary: Rheumatology

## 2020-07-14 MED FILL — ENBREL SURECLICK 50 MG/ML (1 ML) SUBCUTANEOUS PEN INJECTOR: 28 days supply | Qty: 4 | Fill #2 | Status: AC

## 2020-07-14 MED FILL — ENBREL SURECLICK 50 MG/ML (1 ML) SUBCUTANEOUS PEN INJECTOR: SUBCUTANEOUS | 28 days supply | Qty: 4 | Fill #2

## 2020-07-21 NOTE — Unmapped (Signed)
Hi,    Message received from patient who states she recently missed her office visit on 07/26.  She is having a lot of knee pain that is severe.    Patient called the schedulers for an appointment but it will be awhile before she is seen. Patient would like to know if you are able to get her in sooner since she is having a lot of pain.    Please advise.    Karina Zhang

## 2020-07-21 NOTE — Unmapped (Signed)
Message left on patients private voicemail with appointment options.

## 2020-08-05 NOTE — Unmapped (Signed)
[facility] Refill Coordination Note    Specialty Medication(s) to be Shipped:   General Specialty: Enbrel    Other medication(s) to be shipped: No additional medications requested for fill at this time     Karina Zhang, DOB: 07-21-1966  Phone: [phone] (home) [phone] (work)      All above HIPAA information was verified with patient.     Was a Nurse, learning disability used for this call? No    Completed refill call assessment today to schedule patient's medication shipment from the [facility] [phone]).       Specialty medication(s) and dose(s) confirmed: Regimen is correct and unchanged.   Changes to medications: Trilby reports no changes at this time.  Changes to insurance: No  Questions for the pharmacist: No    Confirmed patient received Welcome Packet with first shipment. The patient will receive a drug information handout for each medication shipped and additional FDA Medication Guides as required.       DISEASE/MEDICATION-SPECIFIC INFORMATION        For patients on injectable medications: Patient currently has 0 doses left.  Next injection is scheduled for 08/12/20.    SPECIALTY MEDICATION ADHERENCE     Medication Adherence    Patient reported X missed doses in the last month: 0            Enbrel: 0 days worth of medication on hand.        SHIPPING     Shipping address confirmed in Epic.     Delivery Scheduled: Yes, Expected medication delivery date: 08/10/20.     Medication will be delivered via Same Day Courier to the prescription address in Epic [place].    [name]   [facility] Specialty Technician

## 2020-08-10 MED FILL — ENBREL SURECLICK 50 MG/ML (1 ML) SUBCUTANEOUS PEN INJECTOR: SUBCUTANEOUS | 28 days supply | Qty: 4 | Fill #3

## 2020-08-10 MED FILL — ENBREL SURECLICK 50 MG/ML (1 ML) SUBCUTANEOUS PEN INJECTOR: 28 days supply | Qty: 4 | Fill #3 | Status: AC

## 2020-09-01 DIAGNOSIS — M0579 Rheumatoid arthritis with rheumatoid factor of multiple sites without organ or systems involvement: Principal | ICD-10-CM

## 2020-09-01 MED ORDER — ENBREL SURECLICK 50 MG/ML (1 ML) SUBCUTANEOUS PEN INJECTOR
SUBCUTANEOUS | 3 refills | 28.00000 days | Status: CP
Start: 2020-09-01 — End: ?
  Filled 2020-09-07: qty 4, 28d supply, fill #0

## 2020-09-01 NOTE — Unmapped (Signed)
[facility] Refill Coordination Note    Specialty Medication(s) to be Shipped:   Inflammatory Disorders: Enbrel    Other medication(s) to be shipped: No additional medications requested for fill at this time     Karina Zhang, DOB: 04/07/1966  Phone: [phone] (home) [phone] (work)      All above HIPAA information was verified with patient.     Was a Nurse, learning disability used for this call? No    Completed refill call assessment today to schedule patient's medication shipment from the [facility] [phone]).       Specialty medication(s) and dose(s) confirmed: Regimen is correct and unchanged.   Changes to medications: Karina Zhang reports no changes at this time.  Changes to insurance: No  Questions for the pharmacist: No    Confirmed patient received Welcome Packet with first shipment. The patient will receive a drug information handout for each medication shipped and additional FDA Medication Guides as required.       DISEASE/MEDICATION-SPECIFIC INFORMATION        For patients on injectable medications: Patient currently has 1 doses left.  Next injection is scheduled for 09/02/20.    SPECIALTY MEDICATION ADHERENCE     Medication Adherence    Patient reported X missed doses in the last month: 0  Specialty Medication: Enbrel Sureclick 50mg /mL  Patient is on additional specialty medications: No  Informant: patient                Enbrel 50 mg/ml: 1 days of medicine on hand         SHIPPING     Shipping address confirmed in Epic.     Delivery Scheduled: Yes, Expected medication delivery date: 09/07/20.  However, Rx request for refills was sent to the provider as there are none remaining.     Medication will be delivered via Same Day Courier to the prescription address in Epic [place].    Karina Zhang   [facility] Specialty Technician

## 2020-09-07 MED FILL — ENBREL SURECLICK 50 MG/ML (1 ML) SUBCUTANEOUS PEN INJECTOR: 28 days supply | Qty: 4 | Fill #0 | Status: AC

## 2020-09-10 NOTE — Unmapped (Unsigned)
Internal Medicine Clinic Visit    Reason for visit: Mood f/u, BP f/u    A/P:    1. Depression, unspecified depression type    2. Anxiety    3. Hypertension, unspecified type    4. Rheumatoid arthritis, involving unspecified site, unspecified whether rheumatoid factor present (CMS-HCC)    5. Osteochondral lesion    6. Tobacco dependence        #Depression??- Anxiety  Patient has reported significant depression and anxiety symptoms in the setting of the death of her son, divorce and ongoing right knee pain.??Of note, has had a few suicide attempts in the distant past via attempted overdose. On??high dose??duloxetine 60mg  BID??for months, with the subsequent addition of wellbutrin given declining mood. Last PHQ9 11 in 06/2020 with feelings at that time that mood had improved. PHQ9 today ***. Continues to have ***passive SI, no plan and able to identify multiple protective factors. No changes to regimen today ***. Using buspar ***.  --??Continue duloxetine 60mg  BID  -- Continue bupropion XR 150mg  every day  -- Continue buspirone 5mg  TID PRN for anxiety  -- Crisis number provided on AVS  ??  #HTN  BP significantly above goal 03/2020, started on Lisinopril 5mg , subsequently increased to 7.5mg  daily.??Had been scheduled with [facility], but patient cancelled appointment. Home blood pressures have been running ***. BP today ***. Last chemistry 05/04/20 within normal limits after starting lisinopril.  --??Continue??Lisinopril??7.5mg  every day *** vs increase to 10mg  daily  ??  #Mass of Right Knee - Right Knee Pain  Patient with right knee pain/swelling since 11/2019.??MRI with and without contrast performed 04/15/20 demonstrating a large popliteal cyst, and new large right lateral femoral condyle osteochondral lesion with extensive lateral femoral condyle and tibial plateau bone marrow edema suggestive of contusions or progressive diffuse cartilage disease. Confirmed with Dr. Domingo Cocking in orthopedics that patient does not requires biopsy of the area based on MRI findings, although this is not clearly documented.??Overall highest suspicion that patient's knee inflammation is secondary to her RA. Patient does not want to return to any pain clinics as she participated in pain clinics in the past and did not find it helpful.   -- Follow up with rheumatology as below  -- Continue use of walker for ambulation   -- Consider PT/OT if patient willing  ??  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 Arthritis previously has involved feet, ankles, knees and hands.??Last seen by rheumatology??05/04/20 by Dr. Randell Loop.??Patient??with??poorly controlled symptoms at that??time - started on prednisone taper (completed 06/2020), increased Methotrexate dose.As above, suspect some component of poorly controlled RA contributing to patient's right knee pain. Continues to use walker to ambulate, reports symptoms are improved since changes to her RA regimen in May.  -- Continue??methotrexate 20 mg weekly with daily folic acid  -- Continue??Enbrel 50 mg weekly  -- Follow up with rheumatology as scheduled 11/02/20   ??  #Unintentional Weight Loss  Appears to have lost significant amount of weight??(~20-30 pounds)??in the past 6 months??based on our measurements.??Initially??concerned for malignancy given prior liver lesions seen on RUQ [place] (2019) and right knee mass; however,??knee mass determined to be benign based on MRI per orthopedics, although does have osteochondral lesion - question if this is related to her RA. Certainly pro-inflammatory state in the setting of her RA could contribute to patient's weight loss. Depression/anxiety may also be contributing. Up to date on all cancer screening including mammogram, FIT testing. S/p hysterectomy, no pap smears indicated.   --  Address mood symptoms as above  -- Address RA as above  ??  #Tobacco Use  Patient reports still smoking 1 pack per day. *** phase of change.  ??  #Hyperlipidemia  The 10-year ASCVD risk score Denman George DC Jr., et al., 2013) is: 7.1%. Last lipid panel 04/2020. Multiple risk factors for cardiovascular disease including HTN and tobacco use.  -- Statin therapy not yet indicated given risk <7.5%  ??  #HCM:  [ ]  Cervical Cancer Screening - No longer indicated (Hysterectomy 2010)  [ ]  Colon Cancer Screening - UTD, Completed FIT 11/2019, repeat in 1??years  [ ]  Breast Cancer Screening - UTD, Completed 11/2019, due 11/2020  [ ]  HLD - Lipid panel due 2026  [ ]  HTN - Chem 3 due 04/2021  [ ]  Osteoporosis - Consider if patient remains on >2.5mg  prednisone for > 3 months  [ ]  Hep C - Appears to have had a cleared Hep C infection (12/2019)  [x]  Mood - PHQ9/GAD7 assessed today  [ ]  Vaccines:  ????????????????????????- Flu due today  ????????????????????????- TDap due 09/2028  ????????????????????????- Shingrix not discussed at today's visit  ????????????????????????- PCV13/23 - 23 valent completed 12/2019 and 06/2015  ????????????????????????- COVID - Vaccine information provided on AVS, discussed today    No follow-ups on file.    Staffed with Dr. ***, {seen/discussed:75519}    __________________________________________________________    HPI:  Ms. Barlett is a 54 year-old woman with a history of RA, HTN, distant substance use, COPD/Asthma, tobacco use, anxiety/depression and recent right knee pain in the setting of right knee mass who presents today for mood follow up.  ***  __________________________________________________________    Problem List:  Patient Active Problem List   Diagnosis   ??? Chronic obstructive pulmonary disease with acute exacerbation (CMS-HCC)   ??? Asthma   ??? Back pain with right-sided sciatica   ??? Tobacco dependence   ??? Rheumatoid arthritis (CMS-HCC)   ??? Depression   ??? Hypertension   ??? Anxiety   ??? Unintentional weight loss   ??? Popliteal cyst, right   ??? Osteochondral lesion       Medications:  Reviewed in EPIC  __________________________________________________________    Physical Exam:   Vital Signs:  There were no vitals filed for this visit.    ***  Gen: Well appearing, NAD  CV: RRR, no murmurs  Pulm: CTA bilaterally, no crackles or wheezes  Abd: Soft, NTND, normal BS. No HSM.  Ext: No edema  ***    PHQ-9 Score:     GAD-7 Score:       Medication adherence and barriers to the treatment plan have been addressed. Opportunities to optimize healthy behaviors have been discussed. Patient / caregiver voiced understanding.

## 2020-09-11 ENCOUNTER — Encounter
Admit: 2020-09-11 | Payer: PRIVATE HEALTH INSURANCE | Attending: Student in an Organized Health Care Education/Training Program | Primary: Student in an Organized Health Care Education/Training Program

## 2020-09-14 NOTE — Unmapped (Unsigned)
Internal Medicine Clinic Visit    Reason for visit: Mood f/u, BP f/u, knee pain    A/P:  Karina Zhang is a 54 year-old woman with a history of RA, HTN, distant substance use, COPD/Asthma, tobacco use, anxiety/depression and recent right knee pain in the setting of right knee mass who presents today for mood follow up, HTN f/u, knee pain f/u.    No diagnosis found.    #Depression??- Anxiety  Patient has reported significant depression and anxiety symptoms in the setting of the death of her son, divorce and ongoing right knee pain.??Of note, has had a few suicide attempts in the distant past via attempted overdose. On??high dose??duloxetine 60mg  BID??for months, with the subsequent addition of wellbutrin given declining mood. Last PHQ9 11 in 06/2020 with feelings at that time that mood had improved. PHQ9 today ***. Continues to have ***passive SI, no plan and able to identify multiple protective factors. No changes to regimen today ***. Using buspar ***.  --??Continue duloxetine 60mg  BID  -- Continue bupropion XR 150mg  every day  -- Continue buspirone 5mg  TID PRN for anxiety  -- Crisis number provided on AVS  ??  #HTN  BP significantly above goal 03/2020, started on Lisinopril 5mg , subsequently increased to 7.5mg  daily.??Had been scheduled with [facility], but patient cancelled appointment. Home blood pressures have been running ***. BP today ***. Last chemistry 05/04/20 within normal limits after starting lisinopril.  --??Continue??Lisinopril??7.5mg  every day *** vs increase to 10mg  daily  ??  #Mass of Right Knee - Right Knee Pain  Patient with right knee pain/swelling since 11/2019.??MRI with and without contrast performed 04/15/20 demonstrating a large popliteal cyst, and new large right lateral femoral condyle osteochondral lesion with extensive lateral femoral condyle and tibial plateau bone marrow edema suggestive of contusions or progressive diffuse cartilage disease. Confirmed with Dr. Domingo Cocking in orthopedics that patient does not requires biopsy of the area based on MRI findings, although this is not clearly documented.??Overall highest suspicion that patient's knee inflammation is secondary to her RA. Patient does not want to return to any pain clinics as she participated in pain clinics in the past and did not find it helpful.   -- Follow up with rheumatology as below  -- Continue use of walker for ambulation   -- Consider PT/OT if patient willing  ??  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 Arthritis previously has involved feet, ankles, knees and hands.??Last seen by rheumatology??05/04/20 by Dr. Randell Loop.??Patient??with??poorly controlled symptoms at that??time - started on prednisone taper (completed 06/2020), increased Methotrexate dose.As above, suspect some component of poorly controlled RA contributing to patient's right knee pain. Continues to use walker to ambulate, reports symptoms are improved since changes to her RA regimen in May.  -- Continue??methotrexate 20 mg weekly with daily folic acid  -- Continue??Enbrel 50 mg weekly  -- Follow up with rheumatology as scheduled 11/02/20   ??  #Unintentional Weight Loss  Appears to have lost significant amount of weight??(~20-30 pounds)??in the past 6 months??based on our measurements.??Initially??concerned for malignancy given prior liver lesions seen on RUQ [place] (2019) and right knee mass; however,??knee mass determined to be benign based on MRI per orthopedics, although does have osteochondral lesion - question if this is related to her RA. Certainly pro-inflammatory state in the setting of her RA could contribute to patient's weight loss. Depression/anxiety may also be contributing. Up to date on all cancer screening including mammogram, FIT testing. S/p hysterectomy, no pap smears indicated.   --  Address mood symptoms as above  -- Address RA as above  ??  #Tobacco Use  Patient reports still smoking 1 pack per day. *** phase of change.  ??  #Hyperlipidemia  The 10-year ASCVD risk score Denman George DC Jr., et al., 2013) is: 7.1%. Last lipid panel 04/2020. Multiple risk factors for cardiovascular disease including HTN and tobacco use.  -- Statin therapy not yet indicated given risk <7.5%  ??  #HCM:  [ ]  Cervical Cancer Screening - No longer indicated (Hysterectomy 2010)  [ ]  Colon Cancer Screening - UTD, Completed FIT 11/2019, repeat in 1??years  [ ]  Breast Cancer Screening - UTD, Completed 11/2019, due 11/2020  [ ]  HLD - Lipid panel due 2026  [ ]  HTN - Chem 3 due 04/2021  [ ]  Osteoporosis - Consider if patient remains on >2.5mg  prednisone for > 3 months  [ ]  Hep C - Appears to have had a cleared Hep C infection (12/2019)  [x]  Mood - PHQ9/GAD7 assessed today  [ ]  Vaccines:  ????????????????????????- Flu due today  ????????????????????????- TDap due 09/2028  ????????????????????????- Shingrix not discussed at today's visit  ????????????????????????- PCV13/23 - 23 valent completed 12/2019 and 06/2015  ????????????????????????- COVID - due today ***    No follow-ups on file.    Staffed with Dr. ***, {seen/discussed:75519}    __________________________________________________________    HPI:  Karina Zhang is a 54 year-old woman with a history of RA, HTN, distant substance use, COPD/Asthma, tobacco use, anxiety/depression and recent right knee pain in the setting of right knee mass who presents today for mood follow up, HTN f/u, knee pain f/u.    ***  __________________________________________________________    Problem List:  Patient Active Problem List   Diagnosis   ??? Chronic obstructive pulmonary disease with acute exacerbation (CMS-HCC)   ??? Asthma   ??? Back pain with right-sided sciatica   ??? Tobacco dependence   ??? Rheumatoid arthritis (CMS-HCC)   ??? Depression   ??? Hypertension   ??? Anxiety   ??? Unintentional weight loss   ??? Popliteal cyst, right   ??? Osteochondral lesion       Medications:  Reviewed in EPIC  __________________________________________________________    Physical Exam:   Vital Signs:  There were no vitals filed for this visit.    ***  Gen: Well appearing, NAD  CV: RRR, no murmurs  Pulm: CTA bilaterally, no crackles or wheezes  Abd: Soft, NTND, normal BS. No HSM.  Ext: No edema  ***    PHQ-9 Score:     GAD-7 Score:       Medication adherence and barriers to the treatment plan have been addressed. Opportunities to optimize healthy behaviors have been discussed. Patient / caregiver voiced understanding.

## 2020-10-06 NOTE — Unmapped (Signed)
[facility] Refill Coordination Note    Specialty Medication(s) to be Shipped:   Inflammatory Disorders: Enbrel    Other medication(s) to be shipped: No additional medications requested for fill at this time     Karina Zhang, DOB: 1966/06/01  Phone: [phone] (home) [phone] (work)      All above HIPAA information was verified with patient.     Was a Nurse, learning disability used for this call? No    Completed refill call assessment today to schedule patient's medication shipment from the [facility] [phone]).       Specialty medication(s) and dose(s) confirmed: Regimen is correct and unchanged.   Changes to medications: Karina Zhang reports no changes at this time.  Changes to insurance: No  Questions for the pharmacist: No    Confirmed patient received Welcome Packet with first shipment. The patient will receive a drug information handout for each medication shipped and additional FDA Medication Guides as required.       DISEASE/MEDICATION-SPECIFIC INFORMATION        For patients on injectable medications: Patient currently has 1 doses left.  Next injection is scheduled for 10/27.    SPECIALTY MEDICATION ADHERENCE     Medication Adherence    Patient reported X missed doses in the last month: 0  Specialty Medication: Enbrel  Patient is on additional specialty medications: No  Patient is on more than two specialty medications: No  Any gaps in refill history greater than 2 weeks in the last 3 months: no  Demonstrates understanding of importance of adherence: yes  Informant: patient                Enbrel 50mg /ml: Patient has 7 days of medication on hand       SHIPPING     Shipping address confirmed in Epic.     Delivery Scheduled: Yes, Expected medication delivery date: 11/1.     Medication will be delivered via Same Day Courier to the prescription address in Epic WAM.    [name]   [facility] Specialty Technician

## 2020-10-06 NOTE — Unmapped (Signed)
The [facility] has made a second and final attempt to reach this patient to refill the following medication:Enbrel.      We have left voicemails on the following phone numbers: [phone], [phone], [phone] and have sent a MyChart message.    Dates contacted: 10/21, 10/26  Last scheduled delivery: 9/22    The patient may be at risk of non-compliance with this medication. The patient should call the [facility] at [phone] (option 4) to refill medication.    Karina Zhang   [facility] Specialty Technician

## 2020-10-09 ENCOUNTER — Ambulatory Visit
Admit: 2020-10-09 | Discharge: 2020-10-10 | Payer: PRIVATE HEALTH INSURANCE | Attending: Student in an Organized Health Care Education/Training Program | Primary: Student in an Organized Health Care Education/Training Program

## 2020-10-09 DIAGNOSIS — M25561 Pain in right knee: Principal | ICD-10-CM

## 2020-10-09 DIAGNOSIS — M0579 Rheumatoid arthritis with rheumatoid factor of multiple sites without organ or systems involvement: Principal | ICD-10-CM

## 2020-10-09 DIAGNOSIS — I10 Essential (primary) hypertension: Principal | ICD-10-CM

## 2020-10-09 DIAGNOSIS — R32 Unspecified urinary incontinence: Principal | ICD-10-CM

## 2020-10-09 DIAGNOSIS — F419 Anxiety disorder, unspecified: Principal | ICD-10-CM

## 2020-10-09 DIAGNOSIS — G8929 Other chronic pain: Principal | ICD-10-CM

## 2020-10-09 DIAGNOSIS — F32A Depression: Principal | ICD-10-CM

## 2020-10-09 MED ORDER — BUPROPION HCL XL 150 MG 24 HR TABLET, EXTENDED RELEASE
ORAL_TABLET | Freq: Every morning | ORAL | 3 refills | 90 days | Status: CP
Start: 2020-10-09 — End: 2021-10-09

## 2020-10-09 MED ORDER — FOLIC ACID 1 MG TABLET
ORAL_TABLET | Freq: Every day | ORAL | 11 refills | 30.00000 days | Status: CP
Start: 2020-10-09 — End: 2021-10-09

## 2020-10-09 MED ORDER — LISINOPRIL 5 MG TABLET
ORAL_TABLET | Freq: Every day | ORAL | 3 refills | 90 days | Status: CP
Start: 2020-10-09 — End: 2021-10-09

## 2020-10-09 NOTE — Unmapped (Unsigned)
[place] [facility] at [facility]     Influenza vaccine right deltoid  Type of visit:  face to face    Reason for visit: patient stated right knee    Questions / Concerns that need to be addressed:     General Consent to Treat (GCT) for non-epic video visits only: Verbal consent      o             Screening BP 137/90 P 82        Omron BPs (complete if screening BP has a systolic  > 139 or diastolic > 89)  BP#1 132/86 P 81   BP#2 137/86 P 82  BP#3 132/83 P 83    Average BP 134/85 P 82  (please note this as a comment in vitals)         Allergies reviewed: Yes    Medication reviewed: Yes  Pended refills? Yes        HCDM reviewed and updated in Epic:    We are working to make sure all of our patients??? wishes are updated in Epic and part of that is documenting a Environmental health practitioner for each patient  A Health Care Decision Rodena Piety is someone you choose who can make health care decisions for you if you are not able ??? who would you most want to do this for you????  was updated.        BPAs completed:  {RUEAVWU:98119}      COVID-19 Vaccine Summary  Type:  Dates Given:                   If no: Are you interested in scheduling? Declines vaccine    Immunization History   Administered Date(s) Administered   ??? Influenza Vaccine Quad (IIV4 PF) 60mo+ injectable 11/12/2019   ??? PNEUMOCOCCAL POLYSACCHARIDE 23 07/09/2015, 01/03/2020   ??? TdaP 07/09/2015, 09/25/2018       __________________________________________________________________________________________    SCREENINGS COMPLETED IN FLOWSHEETS    HARK Screening       AUDIT       PHQ2       PHQ9  Thoughts that you would be better off dead, or of hurting yourself in some way: Not at all  PHQ-9 TOTAL SCORE: 24    P4 Suicidality Screener                GAD7       COPD Assessment       Falls Risk       .imcres

## 2020-10-09 NOTE — Unmapped (Addendum)
1. We are referring you to [facility] - [place]. They will call you with an appointment.     Please call radiology to make your MRI right knee appointment: [phone]    You have a follow-up appointment with Dr. Darylene Price on 10/23/20
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
## 2020-10-12 MED FILL — ENBREL SURECLICK 50 MG/ML (1 ML) SUBCUTANEOUS PEN INJECTOR: SUBCUTANEOUS | 28 days supply | Qty: 4 | Fill #1

## 2020-10-12 MED FILL — ENBREL SURECLICK 50 MG/ML (1 ML) SUBCUTANEOUS PEN INJECTOR: 28 days supply | Qty: 4 | Fill #1 | Status: AC

## 2020-10-21 NOTE — Unmapped (Signed)
I received notification today from Officer Daphine Deutscher  that this patient has passed away on 11/05/20.Office Daphine Deutscher is requesting a return call at [phone]. I've updated the patient's Epic record to reflect deceased in the system and all future appointments and reminders/recalls have been cancelled.  Please let me know if I can do anything to help you. Thank you.

## 2020-10-22 NOTE — Unmapped (Signed)
This patient has been disenrolled from the [facility] specialty pharmacy services as the patient is deceased.    Karina Zhang  [facility] [employer] Specialty Pharmacist

## 2020-11-11 DEATH — deceased
